# Patient Record
Sex: Female | Born: 1965 | Race: Black or African American | Hispanic: No | Marital: Single | Smoking: Never smoker
Health system: Southern US, Community
[De-identification: ages and names within clinical notes are randomized; demographics above are authoritative.]

## PROBLEM LIST (undated history)

## (undated) DIAGNOSIS — K5792 Diverticulitis of intestine, part unspecified, without perforation or abscess without bleeding: Secondary | ICD-10-CM

---

## 2017-04-08 ENCOUNTER — Encounter (HOSPITAL_BASED_OUTPATIENT_CLINIC_OR_DEPARTMENT_OTHER): Payer: Self-pay | Admitting: Emergency Medicine

## 2017-04-08 ENCOUNTER — Other Ambulatory Visit: Payer: Self-pay

## 2017-04-08 ENCOUNTER — Emergency Department (HOSPITAL_BASED_OUTPATIENT_CLINIC_OR_DEPARTMENT_OTHER)
Admission: EM | Admit: 2017-04-08 | Discharge: 2017-04-08 | Disposition: A | Discharge status: Home / Self Care | Payer: Self-pay | Attending: Emergency Medicine | Admitting: Emergency Medicine

## 2017-04-08 ENCOUNTER — Emergency Department (HOSPITAL_BASED_OUTPATIENT_CLINIC_OR_DEPARTMENT_OTHER): Payer: Self-pay

## 2017-04-08 DIAGNOSIS — R000 Tachycardia, unspecified: Secondary | ICD-10-CM | POA: Insufficient documentation

## 2017-04-08 DIAGNOSIS — Z20828 Contact with and (suspected) exposure to other viral communicable diseases: Secondary | ICD-10-CM | POA: Insufficient documentation

## 2017-04-08 DIAGNOSIS — J112 Influenza due to unidentified influenza virus with gastrointestinal manifestations: Secondary | ICD-10-CM | POA: Insufficient documentation

## 2017-04-08 DIAGNOSIS — J111 Influenza due to unidentified influenza virus with other respiratory manifestations: Secondary | ICD-10-CM

## 2017-04-08 DIAGNOSIS — R509 Fever, unspecified: Secondary | ICD-10-CM | POA: Insufficient documentation

## 2017-04-08 DIAGNOSIS — R69 Illness, unspecified: Secondary | ICD-10-CM

## 2017-04-08 HISTORY — DX: Diverticulitis of intestine, part unspecified, without perforation or abscess without bleeding: K57.92

## 2017-04-08 LAB — CBC WITH DIFFERENTIAL/PLATELET
BASOS PCT: 0 %
Basophils Absolute: 0 10*3/uL (ref 0.0–0.1)
Eosinophils Absolute: 0 10*3/uL (ref 0.0–0.7)
Eosinophils Relative: 0 %
HEMATOCRIT: 36.2 % (ref 36.0–46.0)
HEMOGLOBIN: 11.9 g/dL — AB (ref 12.0–15.0)
LYMPHS PCT: 6 %
Lymphs Abs: 0.3 10*3/uL — ABNORMAL LOW (ref 0.7–4.0)
MCH: 27.5 pg (ref 26.0–34.0)
MCHC: 32.9 g/dL (ref 30.0–36.0)
MCV: 83.8 fL (ref 78.0–100.0)
Monocytes Absolute: 0.2 10*3/uL (ref 0.1–1.0)
Monocytes Relative: 5 %
NEUTROS ABS: 4.3 10*3/uL (ref 1.7–7.7)
NEUTROS PCT: 89 %
Platelets: 205 10*3/uL (ref 150–400)
RBC: 4.32 MIL/uL (ref 3.87–5.11)
RDW: 12.3 % (ref 11.5–15.5)
WBC: 4.8 10*3/uL (ref 4.0–10.5)

## 2017-04-08 LAB — URINALYSIS, ROUTINE W REFLEX MICROSCOPIC
Bilirubin Urine: NEGATIVE
GLUCOSE, UA: NEGATIVE mg/dL
HGB URINE DIPSTICK: NEGATIVE
Ketones, ur: 15 mg/dL — AB
LEUKOCYTES UA: NEGATIVE
Nitrite: NEGATIVE
PH: 6 (ref 5.0–8.0)
Protein, ur: NEGATIVE mg/dL
Specific Gravity, Urine: 1.025 (ref 1.005–1.030)

## 2017-04-08 LAB — COMPREHENSIVE METABOLIC PANEL
ALK PHOS: 57 U/L (ref 38–126)
ALT: 17 U/L (ref 14–54)
AST: 21 U/L (ref 15–41)
Albumin: 3.8 g/dL (ref 3.5–5.0)
Anion gap: 9 (ref 5–15)
BUN: 11 mg/dL (ref 6–20)
CO2: 26 mmol/L (ref 22–32)
CREATININE: 0.63 mg/dL (ref 0.44–1.00)
Calcium: 9 mg/dL (ref 8.9–10.3)
Chloride: 100 mmol/L — ABNORMAL LOW (ref 101–111)
GFR calc Af Amer: >60 mL/min (ref >60)
GLUCOSE: 102 mg/dL — AB (ref 65–99)
POTASSIUM: 3.8 mmol/L (ref 3.5–5.1)
Sodium: 135 mmol/L (ref 135–145)
TOTAL PROTEIN: 7.2 g/dL (ref 6.5–8.1)
Total Bilirubin: 0.6 mg/dL (ref 0.3–1.2)

## 2017-04-08 LAB — LIPASE, BLOOD: Lipase: 23 U/L (ref 11–51)

## 2017-04-08 LAB — PREGNANCY, URINE: Preg Test, Ur: NEGATIVE

## 2017-04-08 MED ORDER — OSELTAMIVIR PHOSPHATE 75 MG PO CAPS: 75.0000 mg | ORAL_CAPSULE | Freq: Two times a day (BID) | ORAL | 0 refills | Status: DC

## 2017-04-08 MED ORDER — ONDANSETRON 4 MG PO TBDP
4.0000 mg | ORAL_TABLET | Freq: Once | ORAL | Status: AC | PRN
  Administered 2017-04-08: 4 mg via ORAL
  Filled 2017-04-08: qty 1

## 2017-04-08 MED ORDER — SODIUM CHLORIDE 0.9 % IV BOLUS (SEPSIS)
1000.0000 mL | Freq: Once | INTRAVENOUS | Status: AC
Start: 2017-04-08 — End: 2017-04-08
  Administered 2017-04-08: 1000 mL via INTRAVENOUS

## 2017-04-08 MED ORDER — ACETAMINOPHEN 325 MG PO TABS
650.0000 mg | ORAL_TABLET | Freq: Once | ORAL | Status: AC
  Administered 2017-04-08: 650 mg via ORAL
  Filled 2017-04-08: qty 2

## 2017-04-08 MED ORDER — ONDANSETRON HCL 4 MG PO TABS: 4.0000 mg | ORAL_TABLET | Freq: Three times a day (TID) | ORAL | 0 refills | Status: DC | PRN

## 2017-04-08 MED ORDER — OSELTAMIVIR PHOSPHATE 75 MG PO CAPS
75.0000 mg | ORAL_CAPSULE | Freq: Once | ORAL | Status: AC
  Administered 2017-04-08: 75 mg via ORAL
  Filled 2017-04-08: qty 1

## 2017-04-08 NOTE — ED Notes (Signed)
ED Provider at bedside. 

## 2017-04-08 NOTE — ED Triage Notes (Signed)
Vomiting and headache since yesterday.  

## 2017-04-08 NOTE — ED Notes (Signed)
Patient transported to X-ray 

## 2017-04-08 NOTE — ED Notes (Signed)
Pt given ginger ale and crackers per MD approval.

## 2017-04-08 NOTE — ED Notes (Signed)
Pt given d/c instructions as per chart. Rx x 2. Verbalizes understanding. No questions. 

## 2017-04-08 NOTE — Discharge Instructions (Signed)
Based on your recent exposure to influenza, we are concern your constellation of symptoms are due to influenza.  Please take the Tamiflu to help shorten the duration of your symptoms.  Please use the nausea medicine to help with nausea and vomiting so that you can stay hydrated.  We did not find evidence of bacterial infection on your imaging or labs.  Please follow-up with a primary care physician in several days for reassessment.  If any symptoms change or worsen, please return to the nearest emergency department.

## 2017-04-08 NOTE — ED Provider Notes (Signed)
[facility] EMERGENCY DEPARTMENT Provider Note   CSN: [id number] Arrival date & time: 04/08/17  1329     History   Chief Complaint Chief Complaint  Patient presents with  . Emesis  . Headache    HPI Richardean ChimeraMary Ann Loja is a 52 y.o. female.  The history is provided by the patient and medical records. No language interpreter was used.  URI   This is a new problem. The current episode started yesterday. The problem has not changed since onset.The maximum temperature recorded prior to her arrival was 100 to 100.9 F. Associated symptoms include diarrhea, nausea, vomiting, congestion, rhinorrhea and cough. Pertinent negatives include no chest pain, no abdominal pain, no dysuria, no headaches, no sore throat, no neck pain, no rash and no wheezing. She has tried nothing for the symptoms. The treatment provided no relief.    Past Medical History:  Diagnosis Date  . Diverticulitis     There are no active problems to display for this patient.   Past Surgical History:  Procedure Laterality Date  . CESAREAN SECTION      OB History    No data available       Home Medications    Prior to Admission medications   Not on File    Family History No family history on file.  Social History Social History   Tobacco Use  . Smoking status: Never Smoker  . Smokeless tobacco: Never Used  Substance Use Topics  . Alcohol use: No    Frequency: Never  . Drug use: No     Allergies   Patient has no known allergies.   Review of Systems Review of Systems  Constitutional: Positive for chills, fatigue and fever. Negative for diaphoresis.  HENT: Positive for congestion and rhinorrhea. Negative for sore throat.   Respiratory: Positive for cough. Negative for chest tightness, shortness of breath and wheezing.   Cardiovascular: Negative for chest pain and palpitations.  Gastrointestinal: Positive for diarrhea, nausea and vomiting. Negative for abdominal pain.  Genitourinary:  Negative for dysuria, flank pain and hematuria.  Musculoskeletal: Negative for back pain and neck pain.  Skin: Negative for rash.  Neurological: Negative for light-headedness, numbness and headaches.  Psychiatric/Behavioral: Negative for agitation.  All other systems reviewed and are negative.    Physical Exam Updated Vital Signs BP (!) 155/99 (BP Location: Left Arm)   Pulse (!) 110   Temp (!) 100.8 F (38.2 C) (Oral)   Resp 20   Ht 5\' 2"  (1.575 m)   Wt 93.4 kg (206 lb)   SpO2 94%   BMI 37.68 kg/m   Physical Exam  Constitutional: She is oriented to person, place, and time. She appears well-developed and well-nourished.  Non-toxic appearance. She does not appear ill. No distress.  HENT:  Head: Normocephalic and atraumatic.  Mouth/Throat: Oropharynx is clear and moist.  Eyes: Pupils are equal, round, and reactive to light.  Neck: Normal range of motion. Neck supple.  No nuchal rigidity or neck stiffness.  Full range of motion of neck without pain or tenderness.  Cardiovascular: Intact distal pulses. Tachycardia present.  No murmur heard. Pulmonary/Chest: Effort normal. No stridor. No respiratory distress. She has no wheezes. She exhibits no tenderness.  Abdominal: Soft. Bowel sounds are normal. There is no tenderness. There is no guarding.  Musculoskeletal: She exhibits no edema or tenderness.  Neurological: She is alert and oriented to person, place, and time. No cranial nerve deficit or sensory deficit. She exhibits normal muscle tone.  Coordination normal.  Skin: Skin is warm. Capillary refill takes less than 2 seconds. No rash noted. She is not diaphoretic. No erythema.  Psychiatric: She has a normal mood and affect.  Nursing note and vitals reviewed.    ED Treatments / Results  Labs (all labs ordered are listed, but only abnormal results are displayed) Labs Reviewed  CBC WITH DIFFERENTIAL/PLATELET - Abnormal; Notable for the following components:      Result Value    Hemoglobin 11.9 (*)    Lymphs Abs 0.3 (*)    All other components within normal limits  COMPREHENSIVE METABOLIC PANEL - Abnormal; Notable for the following components:   Chloride 100 (*)    Glucose, Bld 102 (*)    All other components within normal limits  URINALYSIS, ROUTINE W REFLEX MICROSCOPIC - Abnormal; Notable for the following components:   Ketones, ur 15 (*)    All other components within normal limits  URINE CULTURE  LIPASE, BLOOD  PREGNANCY, URINE    EKG  EKG Interpretation None       Radiology Dg Chest 2 View  Result Date: 04/08/2017 CLINICAL DATA:  Cough, fever, myalgias EXAM: CHEST  2 VIEW COMPARISON:  None. FINDINGS: Normal heart size. Normal mediastinal contour. No pneumothorax. No pleural effusion. Lungs appear clear, with no acute consolidative airspace disease and no pulmonary edema. IMPRESSION: No active cardiopulmonary disease. Electronically Signed   By: Delbert Phenix M.D.   On: 04/08/2017 16:18    Procedures Procedures (including critical care time)  Medications Ordered in ED Medications  ondansetron (ZOFRAN-ODT) disintegrating tablet 4 mg (4 mg Oral Given 04/08/17 1338)  acetaminophen (TYLENOL) tablet 650 mg (650 mg Oral Given 04/08/17 1339)  sodium chloride 0.9 % bolus 1,000 mL (0 mLs Intravenous Stopped 04/08/17 1734)  oseltamivir (TAMIFLU) capsule 75 mg (75 mg Oral Given 04/08/17 1548)     Initial Impression / Assessment and Plan / ED Course  I have reviewed the triage vital signs and the nursing notes.  Pertinent labs & imaging results that were available during my care of the patient were reviewed by me and considered in my medical decision making (see chart for details).     Vannia Pola is a 52 y.o. female with a past medical history significant for reflux and diverticulitis who presents with 2 days of fevers, chills, myalgias, malaise, oral intake, intermittent diarrhea, urinary frequency, rhinorrhea, congestion, cough, and headache.   Patient reports that her grandson has influenza and she has been feeling time with him.  Patient reports a mild headache with no neck pain or neck stiffness.  No rashes.  She says that she has not had much to eat or drink today due to the nausea vomiting and decreased appetite.  She reports some diarrhea but no constipation.  She denies dysuria but reports some urinary frequency.  She denies any abdominal pain, chest pain, or extremity pains.  She denies new edema in extremities.  She denies other complaints.  On exam, patient's lungs were clear.  Chest was nontender.  Abdomen nontender.  Legs were nonedematous and patient had pulses in all extremities.  No focal neurologic deficits seen.  Normal neck range of motion.  Patient had audible congestion and visible rhinorrhea.  Based on patient's known exposure to influenza I am concerned patient has the flu however due to her cough, urinary symptoms and fevers, patient will have workup to rule out concomitant bacterial infection with pneumonia, UTI.  Patient was given fluids due to her nausea  vomiting diarrhea to look for electrode abnormalities and dehydration.  She will be given fluids during initial workup.  Given her high concern for flu, patient will be treated empirically with Tamiflu.  Anticipate reassessment after fluids and workup.  Patient was feeling better after fluids.  No evidence of bacterial infection on chest x-ray or urinalysis.  No evidence of organ injury from her dehydration.  Suspect patient has influenza based on her known exposure.  Patient will be given prescription for Tamiflu and nausea medication.  Patient will be discharged with plans to follow-up with PCP as well as observe strict return precautions.    Patient has no other questions or concerns and understands plan of care.  Patient discharged in good condition.   Final Clinical Impressions(s) / ED Diagnoses   Final diagnoses:  Influenza-like illness    ED Discharge  Orders        Ordered    oseltamivir (TAMIFLU) 75 MG capsule  Every 12 hours     04/08/17 1907    ondansetron (ZOFRAN) 4 MG tablet  Every 8 hours PRN     04/08/17 1907      Clinical Impression: 1. Influenza-like illness     Disposition: Discharge  Condition: Good  I have discussed the results, Dx and Tx plan with the pt(& family if present). He/she/they expressed understanding and agree(s) with the plan. Discharge instructions discussed at great length. Strict return precautions discussed and pt &/or family have verbalized understanding of the instructions. No further questions at time of discharge.    New Prescriptions   ONDANSETRON (ZOFRAN) 4 MG TABLET    Take 1 tablet (4 mg total) by mouth every 8 (eight) hours as needed for nausea or vomiting.   OSELTAMIVIR (TAMIFLU) 75 MG CAPSULE    Take 1 capsule (75 mg total) by mouth every 12 (twelve) hours.    Follow Up: [facility] [address] [place] [address] [phone] Schedule an appointment as soon as possible for a visit    [facility] [place] EM[facility]Y [facility]PARTMENT [address] [id number] [address] [place] [address] [phone]       Normal Recinos, Canary Brim, MD 04/09/17 0030

## 2017-04-10 LAB — URINE CULTURE

## 2018-04-25 ENCOUNTER — Other Ambulatory Visit: Payer: Self-pay

## 2018-04-25 ENCOUNTER — Encounter (HOSPITAL_COMMUNITY): Payer: Self-pay | Admitting: Emergency Medicine

## 2018-04-25 ENCOUNTER — Emergency Department (HOSPITAL_COMMUNITY)
Admission: EM | Admit: 2018-04-25 | Discharge: 2018-04-25 | Disposition: A | Discharge status: Home / Self Care | Payer: Self-pay | Attending: Emergency Medicine | Admitting: Emergency Medicine

## 2018-04-25 DIAGNOSIS — R1032 Left lower quadrant pain: Secondary | ICD-10-CM | POA: Insufficient documentation

## 2018-04-25 DIAGNOSIS — K5900 Constipation, unspecified: Secondary | ICD-10-CM | POA: Insufficient documentation

## 2018-04-25 LAB — CBC
HCT: 38.4 % (ref 36.0–46.0)
Hemoglobin: 12.1 g/dL (ref 12.0–15.0)
MCH: 27.8 pg (ref 26.0–34.0)
MCHC: 31.5 g/dL (ref 30.0–36.0)
MCV: 88.1 fL (ref 80.0–100.0)
NRBC: 0 % (ref 0.0–0.2)
PLATELETS: 282 10*3/uL (ref 150–400)
RBC: 4.36 MIL/uL (ref 3.87–5.11)
RDW: 12 % (ref 11.5–15.5)
WBC: 7.3 10*3/uL (ref 4.0–10.5)

## 2018-04-25 LAB — COMPREHENSIVE METABOLIC PANEL
ALK PHOS: 67 U/L (ref 38–126)
ALT: 15 U/L (ref 0–44)
ANION GAP: 9 (ref 5–15)
AST: 19 U/L (ref 15–41)
Albumin: 4 g/dL (ref 3.5–5.0)
BILIRUBIN TOTAL: 0.6 mg/dL (ref 0.3–1.2)
BUN: 9 mg/dL (ref 6–20)
CALCIUM: 9.3 mg/dL (ref 8.9–10.3)
CO2: 28 mmol/L (ref 22–32)
CREATININE: 0.82 mg/dL (ref 0.44–1.00)
Chloride: 99 mmol/L (ref 98–111)
Glucose, Bld: 98 mg/dL (ref 70–99)
Potassium: 4 mmol/L (ref 3.5–5.1)
SODIUM: 136 mmol/L (ref 135–145)
TOTAL PROTEIN: 7.7 g/dL (ref 6.5–8.1)

## 2018-04-25 LAB — I-STAT BETA HCG BLOOD, ED (MC, WL, AP ONLY)

## 2018-04-25 LAB — URINALYSIS, ROUTINE W REFLEX MICROSCOPIC
Bilirubin Urine: NEGATIVE
GLUCOSE, UA: NEGATIVE mg/dL
Hgb urine dipstick: NEGATIVE
Ketones, ur: 20 mg/dL — AB
LEUKOCYTE UA: NEGATIVE
NITRITE: NEGATIVE
PROTEIN: NEGATIVE mg/dL
Specific Gravity, Urine: 1.014 (ref 1.005–1.030)
pH: 7 (ref 5.0–8.0)

## 2018-04-25 LAB — LIPASE, BLOOD: Lipase: 27 U/L (ref 11–51)

## 2018-04-25 MED ORDER — METRONIDAZOLE 500 MG PO TABS: 500.0000 mg | ORAL_TABLET | Freq: Two times a day (BID) | ORAL | 0 refills | Status: DC

## 2018-04-25 MED ORDER — CIPROFLOXACIN HCL 500 MG PO TABS
500.0000 mg | ORAL_TABLET | Freq: Once | ORAL | Status: AC
  Administered 2018-04-25: 500 mg via ORAL
  Filled 2018-04-25: qty 1

## 2018-04-25 MED ORDER — ONDANSETRON HCL 4 MG PO TABS: 4.0000 mg | ORAL_TABLET | Freq: Three times a day (TID) | ORAL | 0 refills | Status: DC | PRN

## 2018-04-25 MED ORDER — METRONIDAZOLE 500 MG PO TABS
500.0000 mg | ORAL_TABLET | Freq: Once | ORAL | Status: AC
  Administered 2018-04-25: 500 mg via ORAL
  Filled 2018-04-25: qty 1

## 2018-04-25 MED ORDER — KETOROLAC TROMETHAMINE 30 MG/ML IJ SOLN
30.0000 mg | Freq: Once | INTRAMUSCULAR | Status: AC
Start: 2018-04-25 — End: 2018-04-25
  Administered 2018-04-25: 30 mg via INTRAMUSCULAR
  Filled 2018-04-25: qty 1

## 2018-04-25 MED ORDER — CIPROFLOXACIN HCL 500 MG PO TABS: 500.0000 mg | ORAL_TABLET | Freq: Two times a day (BID) | ORAL | 0 refills | Status: DC

## 2018-04-25 MED ORDER — TRAMADOL HCL 50 MG PO TABS: 50.0000 mg | ORAL_TABLET | Freq: Four times a day (QID) | ORAL | 0 refills | Status: DC | PRN

## 2018-04-25 NOTE — ED Triage Notes (Signed)
Pt c/o lower abd pains for 2 days. Reports started taking probiotics once a day 2 weeks. Ago. Hx diverticulitis.

## 2018-04-25 NOTE — ED Provider Notes (Signed)
[facility]-EMERGENCY DEPT Provider Note   CSN: [id number] Arrival date & time: 04/25/18  1733    History   Chief Complaint Chief Complaint  Patient presents with  . Abdominal Pain    HPI Jennifer Stafford is a 53 y.o. female.     53 year old female with a history of diverticulitis presents to the emergency department for 2 days of lower abdominal pain.  She states that the pain has been constant and gradually worsening.  It waxes and wanes in severity.  She has tried over-the-counter probiotics as well as the use of clear liquids without improvement to her discomfort.  She has noted a small amount of bright red blood streaked stool with her bowel movements.  Endorses increased constipation, though she has continued to pass flatus.  No fevers, nausea, vomiting, urinary symptoms, melena.  Has a history of diverticulitis and states this feels similar.  Abdominal surgical history significant for cesarean section.  The history is provided by the patient. No language interpreter was used.  Abdominal Pain    Past Medical History:  Diagnosis Date  . Diverticulitis     There are no active problems to display for this patient.   Past Surgical History:  Procedure Laterality Date  . CESAREAN SECTION       OB History   No obstetric history on file.      Home Medications    Prior to Admission medications   Medication Sig Start Date End Date Taking? Authorizing Provider  ciprofloxacin (CIPRO) 500 MG tablet Take 1 tablet (500 mg total) by mouth 2 (two) times daily. 04/25/18   Antony Madura, PA-C  metroNIDAZOLE (FLAGYL) 500 MG tablet Take 1 tablet (500 mg total) by mouth 2 (two) times daily. 04/25/18   Antony Madura, PA-C  ondansetron (ZOFRAN) 4 MG tablet Take 1 tablet (4 mg total) by mouth every 8 (eight) hours as needed for nausea or vomiting. 04/25/18   Antony Madura, PA-C  oseltamivir (TAMIFLU) 75 MG capsule Take 1 capsule (75 mg total) by mouth every 12 (twelve)  hours. 04/08/17   Tegeler, Canary Brim, MD  traMADol (ULTRAM) 50 MG tablet Take 1 tablet (50 mg total) by mouth every 6 (six) hours as needed for severe pain. 04/25/18   Antony Madura, PA-C    Family History No family history on file.  Social History Social History   Tobacco Use  . Smoking status: Never Smoker  . Smokeless tobacco: Never Used  Substance Use Topics  . Alcohol use: Yes    Frequency: Never  . Drug use: No     Allergies   Patient has no known allergies.   Review of Systems Review of Systems  Gastrointestinal: Positive for abdominal pain.  Ten systems reviewed and are negative for acute change, except as noted in the HPI.    Physical Exam Updated Vital Signs BP (!) 156/92 (BP Location: Left Arm)   Pulse 96   Temp 99.1 F (37.3 C) (Oral)   Ht 5\' 2"  (1.575 m)   Wt 94 kg   SpO2 100%   BMI 37.90 kg/m   Physical Exam Vitals signs and nursing note reviewed.  Constitutional:      General: She is not in acute distress.    Appearance: She is well-developed. She is not diaphoretic.     Comments: Nontoxic appearing and in NAD  HENT:     Head: Normocephalic and atraumatic.  Eyes:     General: No scleral icterus.  Conjunctiva/sclera: Conjunctivae normal.  Neck:     Musculoskeletal: Normal range of motion.  Cardiovascular:     Rate and Rhythm: Normal rate and regular rhythm.     Pulses: Normal pulses.  Pulmonary:     Effort: Pulmonary effort is normal. No respiratory distress.     Breath sounds: No stridor. No wheezing.     Comments: Respirations even and unlabored Abdominal:     Comments: Abdomen soft, obese.  There is tenderness to palpation in the left lower quadrant with mild guarding.  No peritoneal signs or palpable masses.  Musculoskeletal: Normal range of motion.  Skin:    General: Skin is warm and dry.     Coloration: Skin is not pale.     Findings: No erythema or rash.  Neurological:     Mental Status: She is alert and oriented to person,  place, and time.  Psychiatric:        Behavior: Behavior normal.      ED Treatments / Results  Labs (all labs ordered are listed, but only abnormal results are displayed) Labs Reviewed  URINALYSIS, ROUTINE W REFLEX MICROSCOPIC - Abnormal; Notable for the following components:      Result Value   Ketones, ur 20 (*)    All other components within normal limits  LIPASE, BLOOD  COMPREHENSIVE METABOLIC PANEL  CBC  I-STAT BETA HCG BLOOD, ED ([facility], [facility], [facility] ONLY)    EKG None  Radiology No results found.  Procedures Procedures (including critical care time)  Medications Ordered in ED Medications  ciprofloxacin (CIPRO) tablet 500 mg (has no administration in time range)  metroNIDAZOLE (FLAGYL) tablet 500 mg (has no administration in time range)  ketorolac (TORADOL) 30 MG/ML injection 30 mg (has no administration in time range)     Initial Impression / Assessment and Plan / ED Course  I have reviewed the triage vital signs and the nursing notes.  Pertinent labs & imaging results that were available during my care of the patient were reviewed by me and considered in my medical decision making (see chart for details).        53 year old female presents for 2 days of worsening lower abdominal pain.  She has focal tenderness in the left lower quadrant without peritoneal signs on exam.  No palpable masses.  Clinically, she is well-appearing.  Noted to be afebrile.  She does not meet criteria for SIRS or sepsis.  Labs reassuring without leukocytosis or electrolyte derangements.  Liver and kidney function preserved.  Urinalysis negative for UTI.  Given history of diverticulitis with focal left lower quadrant tenderness, it is likely that she is experiencing a recurrence of symptoms today.  Plan to start on ciprofloxacin and Flagyl for management.  Will forego imaging at this time given low risk for abscess or perforation.  She has been instructed to return to the emergency department for  new or concerning symptoms including worsening abdominal pain, fever, intractable vomiting.  Patient discharged in stable condition with no unaddressed concerns.   Final Clinical Impressions(s) / ED Diagnoses   Final diagnoses:  Left lower quadrant abdominal pain    ED Discharge Orders         Ordered    ciprofloxacin (CIPRO) 500 MG tablet  2 times daily     04/25/18 2309    metroNIDAZOLE (FLAGYL) 500 MG tablet  2 times daily     04/25/18 2309    traMADol (ULTRAM) 50 MG tablet  Every 6 hours PRN  04/25/18 2309    ondansetron (ZOFRAN) 4 MG tablet  Every 8 hours PRN     04/25/18 2309           Antony Madura, Cordelia Poche 04/25/18 2320    Jacalyn Lefevre, MD 04/25/18 [phone]

## 2018-04-25 NOTE — Discharge Instructions (Signed)
You are being treated for presumed diverticulitis.  Take ciprofloxacin and Flagyl as prescribed until finished.  We recommend a clear liquid diet until your symptoms begin to improve.  Continue Tylenol as needed for management of pain.  You may take Zofran as prescribed for nausea.  If your pain is severe, use tramadol as prescribed.  Do not drive or drink alcohol after taking this medication as it may make you drowsy and impair your judgment.  Return to the emergency department for repeat evaluation if you notice worsening abdominal pain, increased vomiting with inability to tolerate fluids by mouth, fever over 101F, or other new or concerning symptoms.  Also return if your symptoms do not improve after 3 days of antibiotic use.

## 2018-09-29 ENCOUNTER — Emergency Department (HOSPITAL_COMMUNITY): Payer: Self-pay

## 2018-09-29 ENCOUNTER — Other Ambulatory Visit: Payer: Self-pay

## 2018-09-29 ENCOUNTER — Emergency Department (HOSPITAL_COMMUNITY)
Admission: EM | Admit: 2018-09-29 | Discharge: 2018-09-29 | Disposition: A | Discharge status: Home / Self Care | Payer: Self-pay | Attending: Emergency Medicine | Admitting: Emergency Medicine

## 2018-09-29 ENCOUNTER — Encounter (HOSPITAL_COMMUNITY): Payer: Self-pay | Admitting: Emergency Medicine

## 2018-09-29 DIAGNOSIS — K5792 Diverticulitis of intestine, part unspecified, without perforation or abscess without bleeding: Secondary | ICD-10-CM | POA: Insufficient documentation

## 2018-09-29 LAB — CBC
HCT: 39 % (ref 36.0–46.0)
Hemoglobin: 12.7 g/dL (ref 12.0–15.0)
MCH: 28.5 pg (ref 26.0–34.0)
MCHC: 32.6 g/dL (ref 30.0–36.0)
MCV: 87.4 fL (ref 80.0–100.0)
Platelets: 248 10*3/uL (ref 150–400)
RBC: 4.46 MIL/uL (ref 3.87–5.11)
RDW: 12.8 % (ref 11.5–15.5)
WBC: 7.6 10*3/uL (ref 4.0–10.5)
nRBC: 0 % (ref 0.0–0.2)

## 2018-09-29 LAB — I-STAT BETA HCG BLOOD, ED (MC, WL, AP ONLY): I-stat hCG, quantitative: <5 m[IU]/mL (ref <5)

## 2018-09-29 LAB — COMPREHENSIVE METABOLIC PANEL
ALT: 17 U/L (ref 0–44)
AST: 19 U/L (ref 15–41)
Albumin: 4.1 g/dL (ref 3.5–5.0)
Alkaline Phosphatase: 66 U/L (ref 38–126)
Anion gap: 11 (ref 5–15)
BUN: 14 mg/dL (ref 6–20)
CO2: 27 mmol/L (ref 22–32)
Calcium: 9.3 mg/dL (ref 8.9–10.3)
Chloride: 99 mmol/L (ref 98–111)
Creatinine, Ser: 0.85 mg/dL (ref 0.44–1.00)
GFR calc Af Amer: >60 mL/min (ref >60)
GFR calc non Af Amer: >60 mL/min (ref >60)
Glucose, Bld: 99 mg/dL (ref 70–99)
Potassium: 3.7 mmol/L (ref 3.5–5.1)
Sodium: 137 mmol/L (ref 135–145)
Total Bilirubin: 0.9 mg/dL (ref 0.3–1.2)
Total Protein: 7.6 g/dL (ref 6.5–8.1)

## 2018-09-29 LAB — URINALYSIS, ROUTINE W REFLEX MICROSCOPIC
Glucose, UA: NEGATIVE mg/dL
Hgb urine dipstick: NEGATIVE
Ketones, ur: 15 mg/dL — AB
Leukocytes,Ua: NEGATIVE
Nitrite: NEGATIVE
Protein, ur: 100 mg/dL — AB
Specific Gravity, Urine: 1.015 (ref 1.005–1.030)
pH: 8.5 — ABNORMAL HIGH (ref 5.0–8.0)

## 2018-09-29 LAB — LIPASE, BLOOD: Lipase: 26 U/L (ref 11–51)

## 2018-09-29 LAB — URINALYSIS, MICROSCOPIC (REFLEX)
Bacteria, UA: NONE SEEN
RBC / HPF: NONE SEEN RBC/hpf (ref 0–5)

## 2018-09-29 MED ORDER — METRONIDAZOLE 500 MG PO TABS
500.0000 mg | ORAL_TABLET | Freq: Once | ORAL | Status: AC
  Administered 2018-09-29: 500 mg via ORAL
  Filled 2018-09-29: qty 1

## 2018-09-29 MED ORDER — HYDROMORPHONE HCL 1 MG/ML IJ SOLN
0.5000 mg | Freq: Once | INTRAMUSCULAR | Status: AC
  Administered 2018-09-29: 22:00:00 0.5 mg via INTRAVENOUS
  Filled 2018-09-29: qty 1

## 2018-09-29 MED ORDER — IOHEXOL 300 MG/ML  SOLN
100.0000 mL | Freq: Once | INTRAMUSCULAR | Status: AC | PRN
  Administered 2018-09-29: 22:00:00 100 mL via INTRAVENOUS

## 2018-09-29 MED ORDER — METRONIDAZOLE 500 MG PO TABS: 500.0000 mg | ORAL_TABLET | Freq: Two times a day (BID) | ORAL | 0 refills | Status: DC

## 2018-09-29 MED ORDER — CIPROFLOXACIN HCL 500 MG PO TABS
500.0000 mg | ORAL_TABLET | Freq: Once | ORAL | Status: AC
  Administered 2018-09-29: 500 mg via ORAL
  Filled 2018-09-29: qty 1

## 2018-09-29 MED ORDER — TRAMADOL HCL 50 MG PO TABS: 50.0000 mg | ORAL_TABLET | Freq: Four times a day (QID) | ORAL | 0 refills | Status: DC | PRN

## 2018-09-29 MED ORDER — ONDANSETRON HCL 4 MG/2ML IJ SOLN
4.0000 mg | Freq: Once | INTRAMUSCULAR | Status: AC
  Administered 2018-09-29: 4 mg via INTRAVENOUS
  Filled 2018-09-29: qty 2

## 2018-09-29 MED ORDER — SODIUM CHLORIDE 0.9% FLUSH: 3.0000 mL | Freq: Once | INTRAVENOUS | Status: DC

## 2018-09-29 MED ORDER — SODIUM CHLORIDE 0.9 % IV BOLUS
1000.0000 mL | Freq: Once | INTRAVENOUS | Status: AC
  Administered 2018-09-29: 21:00:00 1000 mL via INTRAVENOUS

## 2018-09-29 MED ORDER — SODIUM CHLORIDE (PF) 0.9 % IJ SOLN
INTRAMUSCULAR | Status: AC
  Filled 2018-09-29: qty 50

## 2018-09-29 MED ORDER — ONDANSETRON HCL 4 MG PO TABS: 4.0000 mg | ORAL_TABLET | Freq: Three times a day (TID) | ORAL | 0 refills | Status: DC | PRN

## 2018-09-29 MED ORDER — CIPROFLOXACIN HCL 500 MG PO TABS: 500.0000 mg | ORAL_TABLET | Freq: Two times a day (BID) | ORAL | 0 refills | Status: DC

## 2018-09-29 NOTE — Discharge Instructions (Addendum)
See your Physician for recheck.  Return if any problems.  °

## 2018-09-29 NOTE — ED Triage Notes (Signed)
Patient c/o generalized abdominal pain since yesterday. Reports one episode of vomiting and one stool with blood. Hx diverticulitis.

## 2018-09-29 NOTE — ED Notes (Signed)
Pt requesting drink, informed her she will have to wait to see the MD first

## 2018-09-30 NOTE — ED Provider Notes (Signed)
[facility]-EMERGENCY DEPT Provider Note   CSN: [id number] Arrival date & time: 09/29/18  1952     History   Chief Complaint Chief Complaint  Patient presents with   Abdominal Pain    HPI Jennifer Stafford is a 53 y.o. female.     The history is provided by the patient. No language interpreter was used.  Abdominal Pain Pain location:  LLQ Pain quality: aching   Pain radiates to:  Does not radiate Pain severity:  Moderate Onset quality:  Gradual Timing:  Constant Progression:  Worsening Chronicity:  New Relieved by:  Nothing Worsened by:  Nothing Ineffective treatments:  None tried Associated symptoms: nausea and vomiting   Risk factors: no alcohol abuse and has not had multiple surgeries   Pt reports she has had diverticulitis in the past.  Pt feels like she may have again.  Pt reports severe pain left lower abdomen,  Pt vomited once today.   Past Medical History:  Diagnosis Date   Diverticulitis     There are no active problems to display for this patient.   Past Surgical History:  Procedure Laterality Date   CESAREAN SECTION       OB History   No obstetric history on file.      Home Medications    Prior to Admission medications   Medication Sig Start Date End Date Taking? Authorizing Provider  acetaminophen (TYLENOL) 500 MG tablet Take 1,000 mg by mouth every 6 (six) hours as needed for moderate pain.   Yes [provider]  aspirin-sod bicarb-citric acid (ALKA-SELTZER ORIGINAL) 325 MG TBEF tablet Take 325 mg by mouth every 6 (six) hours as needed (acid reflux).   Yes [provider]  ciprofloxacin (CIPRO) 500 MG tablet Take 1 tablet (500 mg total) by mouth 2 (two) times daily. 09/29/18   Elson AreasSofia, Jaleeya Mcnelly K, PA-C  metroNIDAZOLE (FLAGYL) 500 MG tablet Take 1 tablet (500 mg total) by mouth 2 (two) times daily. 09/29/18   Elson AreasSofia, Ellawyn Wogan K, PA-C  ondansetron (ZOFRAN) 4 MG tablet Take 1 tablet (4 mg total) by mouth every 8  (eight) hours as needed for nausea or vomiting. 09/29/18   Elson AreasSofia, Jozee Hammer K, PA-C  traMADol (ULTRAM) 50 MG tablet Take 1 tablet (50 mg total) by mouth every 6 (six) hours as needed for severe pain. 09/29/18   Elson AreasSofia, Kahlea Cobert K, PA-C    Family History No family history on file.  Social History Social History   Tobacco Use   Smoking status: Never Smoker   Smokeless tobacco: Never Used  Substance Use Topics   Alcohol use: Yes    Frequency: Never   Drug use: No     Allergies   Patient has no known allergies.   Review of Systems Review of Systems  Gastrointestinal: Positive for abdominal pain, nausea and vomiting.  All other systems reviewed and are negative.    Physical Exam Updated Vital Signs BP (!) 149/83    Pulse 97    Temp 99.7 F (37.6 C) (Oral)    Resp 18    Ht 5\' 2"  (1.575 m)    Wt 99.8 kg    SpO2 98%    BMI 40.24 kg/m   Physical Exam Vitals signs and nursing note reviewed.  Constitutional:      Appearance: She is well-developed.  HENT:     Head: Normocephalic.  Neck:     Musculoskeletal: Normal range of motion.  Cardiovascular:     Rate and Rhythm:  Normal rate and regular rhythm.  Pulmonary:     Effort: Pulmonary effort is normal.  Abdominal:     General: Abdomen is flat. There is no distension.     Tenderness: There is abdominal tenderness in the left lower quadrant. There is no guarding.     Hernia: No hernia is present.  Musculoskeletal: Normal range of motion.  Skin:    General: Skin is warm.  Neurological:     Mental Status: She is alert and oriented to person, place, and time.      ED Treatments / Results  Labs (all labs ordered are listed, but only abnormal results are displayed) Labs Reviewed  URINALYSIS, ROUTINE W REFLEX MICROSCOPIC - Abnormal; Notable for the following components:      Result Value   pH 8.5 (*)    Bilirubin Urine SMALL (*)    Ketones, ur 15 (*)    Protein, ur 100 (*)    All other components within normal limits    LIPASE, BLOOD  COMPREHENSIVE METABOLIC PANEL  CBC  URINALYSIS, MICROSCOPIC (REFLEX)  I-STAT BETA HCG BLOOD, ED ([facility], [facility], [facility] ONLY)    EKG None  Radiology Ct Abdomen Pelvis W Contrast  Result Date: 09/29/2018 CLINICAL DATA:  Abdominal pain with diverticulitis suspected EXAM: CT ABDOMEN AND PELVIS WITH CONTRAST TECHNIQUE: Multidetector CT imaging of the abdomen and pelvis was performed using the standard protocol following bolus administration of intravenous contrast. CONTRAST:  100mL OMNIPAQUE IOHEXOL 300 MG/ML  SOLN COMPARISON:  CT from 09/28/2016 could not be retrieved for comparison. The report is available for review. FINDINGS: Lower chest: The lung bases are clear. The heart size is normal. Hepatobiliary: The liver is normal. Normal gallbladder.There is no biliary ductal dilation. Pancreas: Normal contours without ductal dilatation. No peripancreatic fluid collection. Spleen: No splenic laceration or hematoma. Adrenals/Urinary Tract: --Adrenal glands: No adrenal hemorrhage. --Right kidney/ureter: No hydronephrosis or perinephric hematoma. --Left kidney/ureter: No hydronephrosis or perinephric hematoma. --Urinary bladder: Unremarkable. Stomach/Bowel: --Stomach/Duodenum: There is a small hiatal hernia. --Small bowel: No dilatation or inflammation. --Colon: There is acute sigmoid diverticulitis. They are is no significant surrounding free air. There is no adjacent drainable fluid collection concerning for abscess. There is scattered colonic diverticula throughout the remaining portions of the colon. --Appendix: The appendix is moderately dilated measuring up to approximately 9 mm proximally. There are several appendicular lists towards the distal aspect of the appendix. The appendix remains air-filled without significant periappendiceal inflammatory changes. Vascular/Lymphatic: Normal course and caliber of the major abdominal vessels. --No retroperitoneal lymphadenopathy. --No mesenteric  lymphadenopathy. --No pelvic or inguinal lymphadenopathy. Reproductive: There is a fibroid uterus. There is a 5.7 by 5.2 cm by 6.6 cm left ovarian fat containing lesion. There is a small punctate calcification within this lesion. Other: No ascites or free air. The abdominal wall is normal. Musculoskeletal. No acute displaced fractures. IMPRESSION: 1. Acute uncomplicated sigmoid diverticulitis. 2. Dilated appendix with multiple appendicolths but no definite CT evidence for acute appendicitis. 3. Fibroid uterus. 4. 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 Electronically Signed   By: Katherine Mantlehristopher  Green M.D.   On: 09/29/2018 22:15    Procedures Procedures (including critical care time)  Medications Ordered in ED Medications  sodium chloride flush (NS) 0.9 % injection 3 mL (has no administration in time range)  sodium chloride (PF) 0.9 % injection (has no administration in time range)  sodium chloride 0.9 % bolus 1,000 mL (0 mLs Intravenous Stopped 09/29/18 2255)  iohexol (OMNIPAQUE) 300 MG/ML solution  100 mL (100 mLs Intravenous Contrast Given 09/29/18 2151)  HYDROmorphone (DILAUDID) injection 0.5 mg (0.5 mg Intravenous Given 09/29/18 2210)  ondansetron (ZOFRAN) injection 4 mg (4 mg Intravenous Given 09/29/18 2210)  ciprofloxacin (CIPRO) tablet 500 mg (500 mg Oral Given 09/29/18 2305)  metroNIDAZOLE (FLAGYL) tablet 500 mg (500 mg Oral Given 09/29/18 2305)     Initial Impression / Assessment and Plan / ED Course  I have reviewed the triage vital signs and the nursing notes.  Pertinent labs & imaging results that were available during my care of the patient were reviewed by me and considered in my medical decision making (see chart for details).        MDM  Dr. Sedonia Small in to see and examine  Pt counseled on results of ct including diverticulitis, fibroid and ovarian cyst   Pt advised to follow up with her primary MD.  Pt started on cipro and flagyl.    Final Clinical Impressions(s) / ED Diagnoses    Final diagnoses:  Diverticulitis    ED Discharge Orders         Ordered    ciprofloxacin (CIPRO) 500 MG tablet  2 times daily     09/29/18 2253    metroNIDAZOLE (FLAGYL) 500 MG tablet  2 times daily     09/29/18 2253    traMADol (ULTRAM) 50 MG tablet  Every 6 hours PRN     09/29/18 2253    ondansetron (ZOFRAN) 4 MG tablet  Every 8 hours PRN     09/29/18 2253        An After Visit Summary was printed and given to the patient.    Fransico Meadow, PA-C 09/30/18 0014    Rolland Porter, MD 09/30/18 [phone]

## 2021-03-05 IMAGING — CT CT ABDOMEN AND PELVIS WITH CONTRAST
1 of 3 series · 13 of 32 positions shown, 18 images · IV contrast (omnipaque)
Comparison: CT from 09/28/2016 could not be retrieved for
comparison. The report is available for review.

CLINICAL DATA: Abdominal pain with diverticulitis suspected

EXAM:
CT ABDOMEN AND PELVIS WITH CONTRAST
TECHNIQUE: Multidetector CT imaging of the abdomen and pelvis was performed
using the standard protocol following bolus administration of
intravenous contrast.
CONTRAST:  100mL OMNIPAQUE IOHEXOL 300 MG/ML  SOLN

[Series 2: axial st · axial · 0.75mm/px · z∈[+1007,+1387]mm · 13 of 88 slices shown, 18 images]
[im 6/88  soft-tissue]
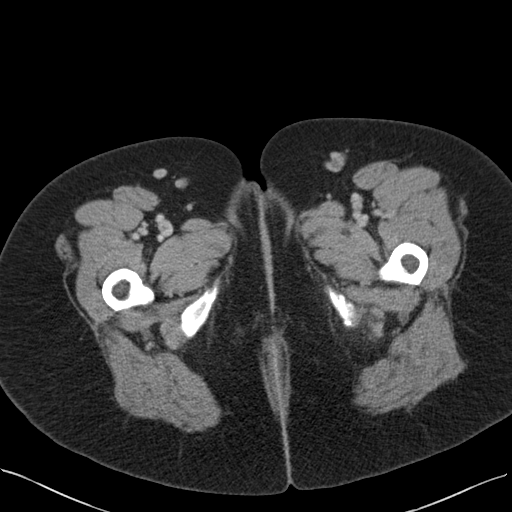
[im 6/88  bone]
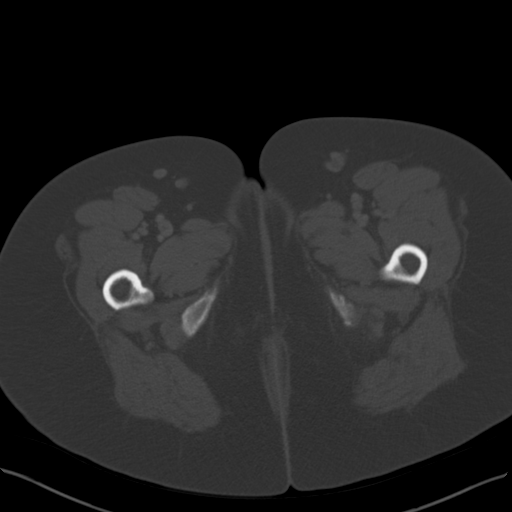
[im 16/88  soft-tissue]
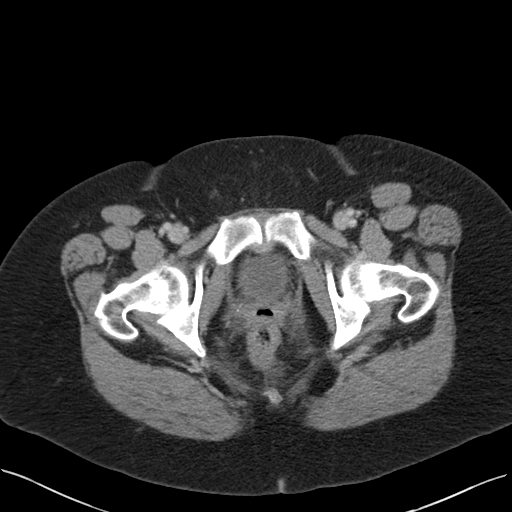
[im 21/88  soft-tissue]
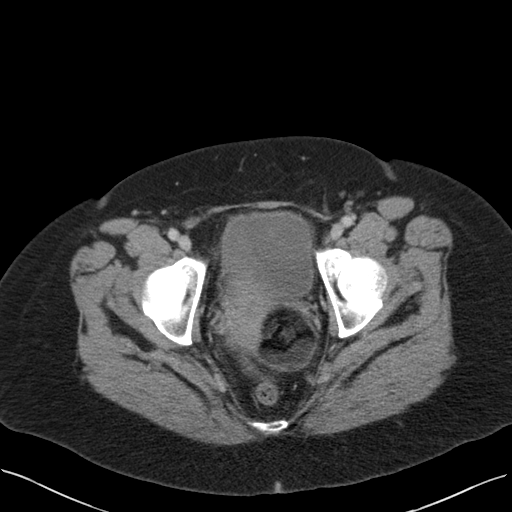
[im 26/88  soft-tissue]
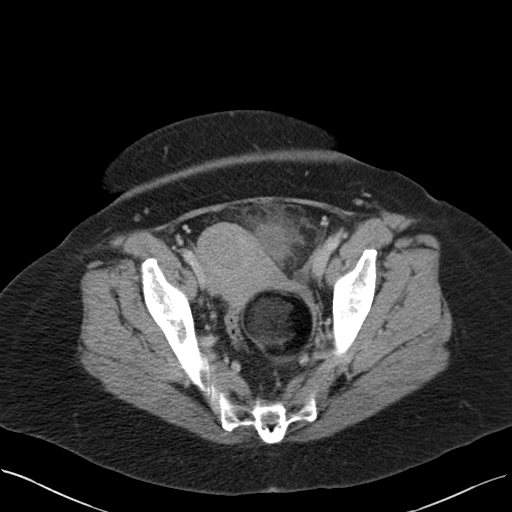
[im 36/88  soft-tissue]
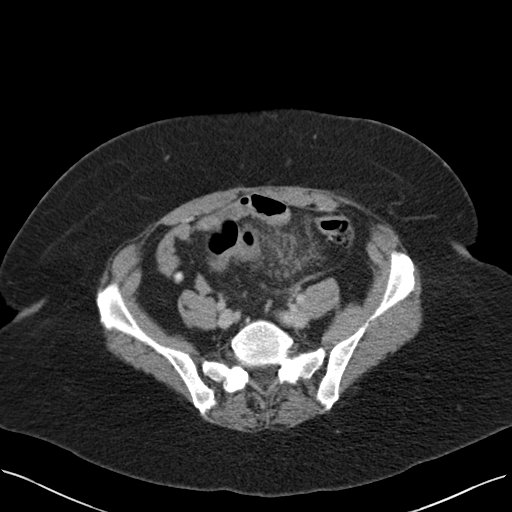
[im 41/88  soft-tissue]
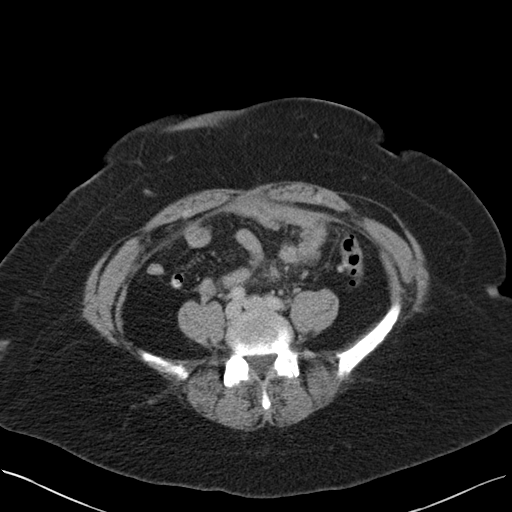
[im 47/88  soft-tissue]
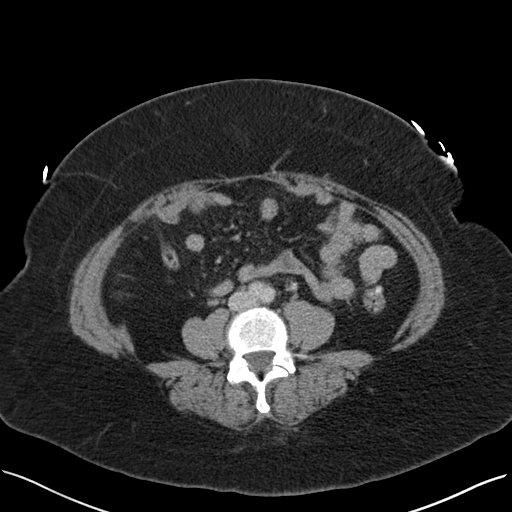
[im 57/88  soft-tissue]
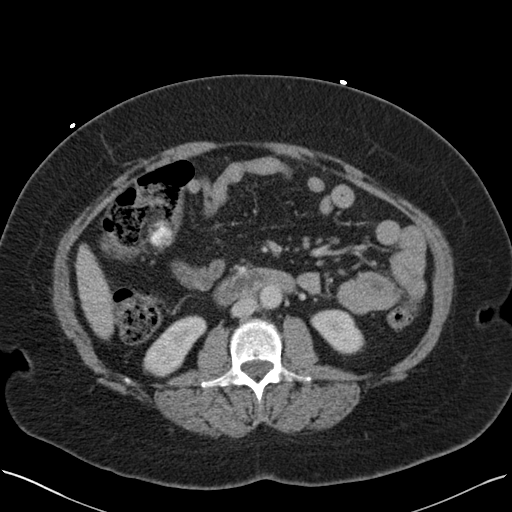
[im 62/88  soft-tissue]
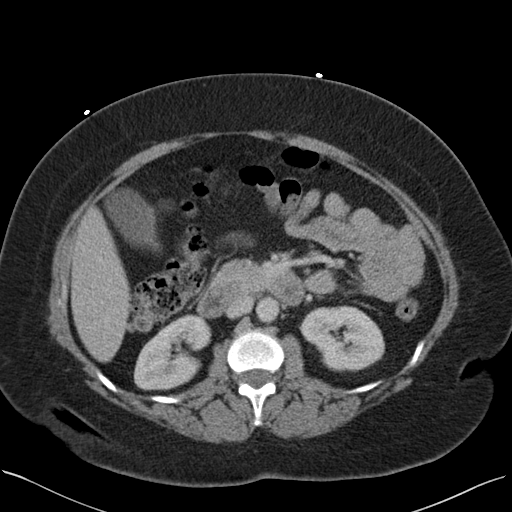
[im 62/88  bone]
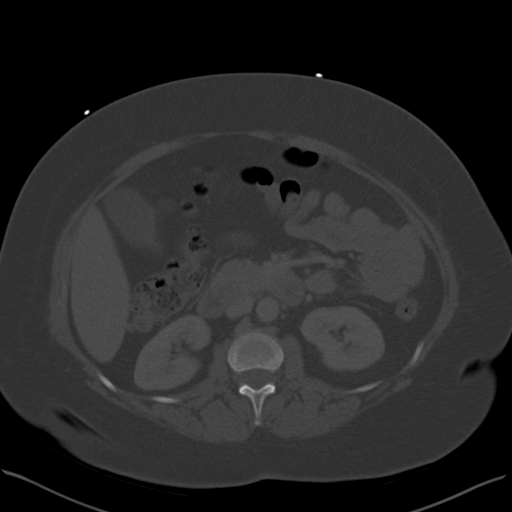
[im 67/88  soft-tissue]
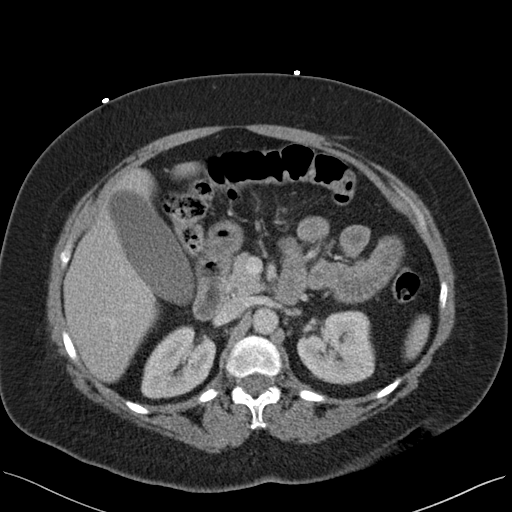
[im 67/88  lung]
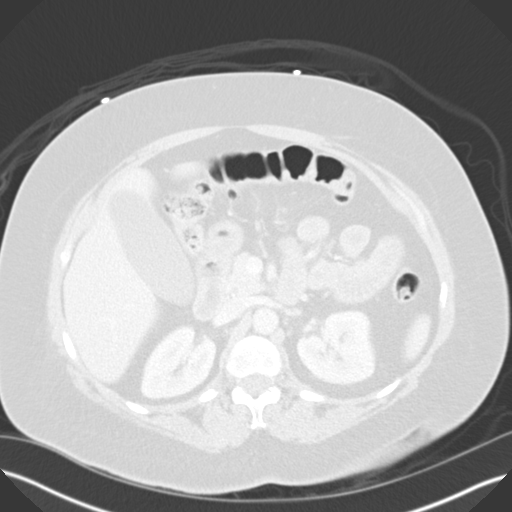
[im 72/88  lung]
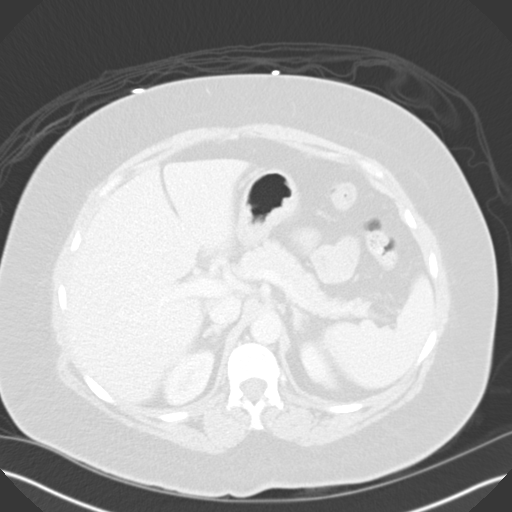
[im 77/88  soft-tissue]
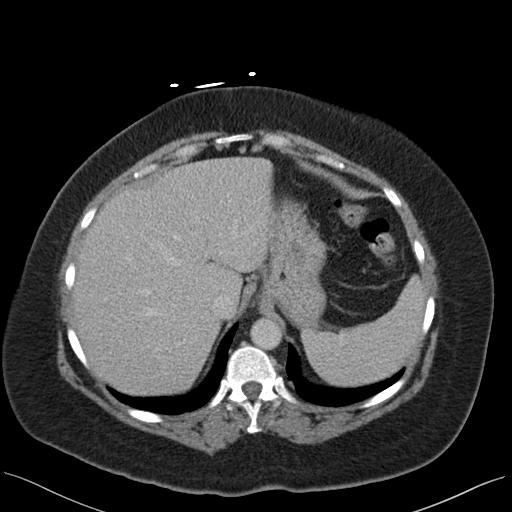
[im 77/88  lung]
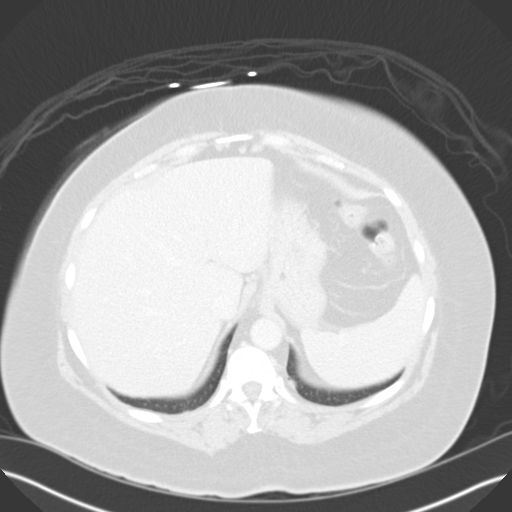
[im 82/88  soft-tissue]
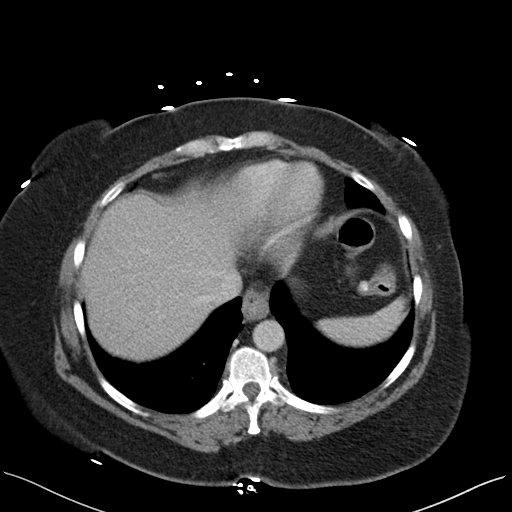
[im 82/88  lung]
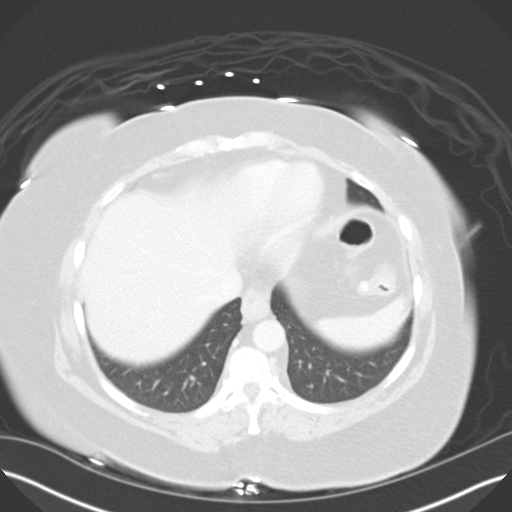

[13 of 32 positions shown; findings below may reference images not displayed]

FINDINGS: Lower chest: The lung bases are clear. The heart size is normal.

Hepatobiliary: The liver is normal. Normal gallbladder.There is no
biliary ductal dilation.

Pancreas: Normal contours without ductal dilatation. No
peripancreatic fluid collection.

Spleen: No splenic laceration or hematoma.

Adrenals/Urinary Tract:

--Adrenal glands: No adrenal hemorrhage.

--Right kidney/ureter: No hydronephrosis or perinephric hematoma.

--Left kidney/ureter: No hydronephrosis or perinephric hematoma.

--Urinary bladder: Unremarkable.

Stomach/Bowel:

--Stomach/Duodenum: There is a small hiatal hernia.

--Small bowel: No dilatation or inflammation.

--Colon: There is acute sigmoid diverticulitis. They are is no
significant surrounding free air. There is no adjacent drainable
fluid collection concerning for abscess. There is scattered colonic
diverticula throughout the remaining portions of the colon.

--Appendix: The appendix is moderately dilated measuring up to
approximately 9 mm proximally. There are several appendicular lists
towards the distal aspect of the appendix. The appendix remains
air-filled without significant periappendiceal inflammatory changes.

Vascular/Lymphatic: Normal course and caliber of the major abdominal
vessels.

--No retroperitoneal lymphadenopathy.

--No mesenteric lymphadenopathy.

--No pelvic or inguinal lymphadenopathy.

Reproductive: There is a fibroid uterus. There is a 5.7 by 5.2 cm by
6.6 cm left ovarian fat containing lesion. There is a small punctate
calcification within this lesion.

Other: No ascites or free air. The abdominal wall is normal.

Musculoskeletal. No acute displaced fractures.
IMPRESSION: 1. Acute uncomplicated sigmoid diverticulitis.
2. Dilated appendix with multiple appendicolths but no definite CT
evidence for acute appendicitis.
3. Fibroid uterus.
4. Again noted is a 6.6 cm left ovarian dermoid.

## 2022-01-20 ENCOUNTER — Emergency Department (HOSPITAL_BASED_OUTPATIENT_CLINIC_OR_DEPARTMENT_OTHER): Payer: BLUE CROSS/BLUE SHIELD

## 2022-01-20 ENCOUNTER — Emergency Department (HOSPITAL_BASED_OUTPATIENT_CLINIC_OR_DEPARTMENT_OTHER)
Admission: EM | Admit: 2022-01-20 | Discharge: 2022-01-20 | Disposition: A | Discharge status: Home / Self Care | Payer: BLUE CROSS/BLUE SHIELD | Source: Home / Self Care | Attending: Emergency Medicine | Admitting: Emergency Medicine

## 2022-01-20 ENCOUNTER — Encounter (HOSPITAL_BASED_OUTPATIENT_CLINIC_OR_DEPARTMENT_OTHER): Payer: Self-pay

## 2022-01-20 DIAGNOSIS — K5732 Diverticulitis of large intestine without perforation or abscess without bleeding: Secondary | ICD-10-CM | POA: Insufficient documentation

## 2022-01-20 DIAGNOSIS — K5792 Diverticulitis of intestine, part unspecified, without perforation or abscess without bleeding: Secondary | ICD-10-CM

## 2022-01-20 DIAGNOSIS — Z6836 Body mass index (BMI) 36.0-36.9, adult: Secondary | ICD-10-CM | POA: Diagnosis not present

## 2022-01-20 DIAGNOSIS — E6609 Other obesity due to excess calories: Secondary | ICD-10-CM | POA: Diagnosis not present

## 2022-01-20 DIAGNOSIS — R109 Unspecified abdominal pain: Secondary | ICD-10-CM | POA: Diagnosis present

## 2022-01-20 DIAGNOSIS — Z7982 Long term (current) use of aspirin: Secondary | ICD-10-CM | POA: Diagnosis not present

## 2022-01-20 DIAGNOSIS — Z79899 Other long term (current) drug therapy: Secondary | ICD-10-CM | POA: Diagnosis not present

## 2022-01-20 LAB — COMPREHENSIVE METABOLIC PANEL
ALT: 15 U/L (ref 0–44)
AST: 16 U/L (ref 15–41)
Albumin: 3.7 g/dL (ref 3.5–5.0)
Alkaline Phosphatase: 61 U/L (ref 38–126)
Anion gap: 6 (ref 5–15)
BUN: 12 mg/dL (ref 6–20)
CO2: 26 mmol/L (ref 22–32)
Calcium: 9.3 mg/dL (ref 8.9–10.3)
Chloride: 105 mmol/L (ref 98–111)
Creatinine, Ser: 0.67 mg/dL (ref 0.44–1.00)
GFR, Estimated: >60 mL/min (ref >60)
Glucose, Bld: 91 mg/dL (ref 70–99)
Potassium: 4 mmol/L (ref 3.5–5.1)
Sodium: 137 mmol/L (ref 135–145)
Total Bilirubin: 0.7 mg/dL (ref 0.3–1.2)
Total Protein: 7.4 g/dL (ref 6.5–8.1)

## 2022-01-20 LAB — URINALYSIS, ROUTINE W REFLEX MICROSCOPIC
Bilirubin Urine: NEGATIVE
Glucose, UA: NEGATIVE mg/dL
Hgb urine dipstick: NEGATIVE
Ketones, ur: NEGATIVE mg/dL
Leukocytes,Ua: NEGATIVE
Nitrite: NEGATIVE
Protein, ur: NEGATIVE mg/dL
Specific Gravity, Urine: 1.02 (ref 1.005–1.030)
pH: 5.5 (ref 5.0–8.0)

## 2022-01-20 LAB — CBC WITH DIFFERENTIAL/PLATELET
Abs Immature Granulocytes: 0.01 10*3/uL (ref 0.00–0.07)
Basophils Absolute: 0 10*3/uL (ref 0.0–0.1)
Basophils Relative: 1 %
Eosinophils Absolute: 0.1 10*3/uL (ref 0.0–0.5)
Eosinophils Relative: 2 %
HCT: 34.7 % — ABNORMAL LOW (ref 36.0–46.0)
Hemoglobin: 11.3 g/dL — ABNORMAL LOW (ref 12.0–15.0)
Immature Granulocytes: 0 %
Lymphocytes Relative: 41 %
Lymphs Abs: 2.2 10*3/uL (ref 0.7–4.0)
MCH: 28.5 pg (ref 26.0–34.0)
MCHC: 32.6 g/dL (ref 30.0–36.0)
MCV: 87.6 fL (ref 80.0–100.0)
Monocytes Absolute: 0.2 10*3/uL (ref 0.1–1.0)
Monocytes Relative: 5 %
Neutro Abs: 2.7 10*3/uL (ref 1.7–7.7)
Neutrophils Relative %: 51 %
Platelets: 273 10*3/uL (ref 150–400)
RBC: 3.96 MIL/uL (ref 3.87–5.11)
RDW: 12.4 % (ref 11.5–15.5)
WBC: 5.3 10*3/uL (ref 4.0–10.5)
nRBC: 0 % (ref 0.0–0.2)

## 2022-01-20 LAB — LIPASE, BLOOD: Lipase: 28 U/L (ref 11–51)

## 2022-01-20 MED ORDER — CIPROFLOXACIN HCL 500 MG PO TABS: 500.0000 mg | ORAL_TABLET | Freq: Two times a day (BID) | ORAL | 0 refills | Status: DC

## 2022-01-20 MED ORDER — BISACODYL 5 MG PO TBEC: 10.0000 mg | DELAYED_RELEASE_TABLET | Freq: Every day | ORAL | 0 refills | Status: AC | PRN

## 2022-01-20 MED ORDER — METRONIDAZOLE 500 MG PO TABS
500.0000 mg | ORAL_TABLET | Freq: Once | ORAL | Status: AC
  Administered 2022-01-20: 500 mg via ORAL
  Filled 2022-01-20: qty 1

## 2022-01-20 MED ORDER — HYDROCODONE-ACETAMINOPHEN 5-325 MG PO TABS: 1.0000 | ORAL_TABLET | Freq: Four times a day (QID) | ORAL | 0 refills | Status: DC | PRN

## 2022-01-20 MED ORDER — CIPROFLOXACIN HCL 500 MG PO TABS
500.0000 mg | ORAL_TABLET | Freq: Once | ORAL | Status: AC
  Administered 2022-01-20: 500 mg via ORAL
  Filled 2022-01-20: qty 1

## 2022-01-20 MED ORDER — METRONIDAZOLE 500 MG PO TABS: 500.0000 mg | ORAL_TABLET | Freq: Three times a day (TID) | ORAL | 0 refills | Status: DC

## 2022-01-20 MED ORDER — IOHEXOL 300 MG/ML  SOLN
100.0000 mL | Freq: Once | INTRAMUSCULAR | Status: AC | PRN
  Administered 2022-01-20: 100 mL via INTRAVENOUS

## 2022-01-20 MED ORDER — SODIUM CHLORIDE 0.9 % IV BOLUS
1000.0000 mL | Freq: Once | INTRAVENOUS | Status: AC
  Administered 2022-01-20: 1000 mL via INTRAVENOUS

## 2022-01-20 NOTE — ED Triage Notes (Signed)
C/o abdominal pain for the past few weeks. Hx of diverticulitis, feels the same.

## 2022-01-20 NOTE — ED Provider Notes (Signed)
[facility] EMERGENCY DEPARTMENT Provider Note   CSN: [id number] Arrival date & time: 01/20/22  4098     History  Chief Complaint  Patient presents with   Abdominal Pain    Jennifer Stafford is a 56 y.o. female.  Jennifer Stafford is a 56 y.o. female with a history of diverticulitis, who presents emergency department for evaluation of abdominal pain.  Patient reports she has been having lower abdominal pain over the past few weeks.  Reports it feels similar to her prior episode of diverticulitis, most recent episode was about 4 to 5 months ago.  She reports that she has been trying to use Tylenol and drink plenty of fluids and rest her bowels but pain has not gotten better.  She denies worsening of pain.  No associated fevers or chills, nausea or vomiting.  Did have 1 episode of blood in her stool about 2 days ago but blood has not continued.  Patient reports that she had a GI doctor in [place] but has not establish care locally since moving to [place] and has not had a colonoscopy.  No prior abdominal surgeries, no other aggravating or alleviating factors.  The history is provided by the patient and medical records.  Abdominal Pain Associated symptoms: no chest pain, no chills, no cough, no diarrhea, no dysuria, no fever, no nausea, no shortness of breath and no vomiting        Home Medications Prior to Admission medications   Medication Sig Start Date End Date Taking? Authorizing Provider  bisacodyl (DULCOLAX) 5 MG EC tablet Take 2 tablets (10 mg total) by mouth daily as needed for moderate constipation. 01/20/22  Yes Dartha Lodge, PA-C  ciprofloxacin (CIPRO) 500 MG tablet Take 1 tablet (500 mg total) by mouth 2 (two) times daily. 01/20/22  Yes Dartha Lodge, PA-C  HYDROcodone-acetaminophen (NORCO/VICODIN) 5-325 MG tablet Take 1 tablet by mouth every 6 (six) hours as needed for severe pain. 01/20/22  Yes Dartha Lodge, PA-C  metroNIDAZOLE (FLAGYL) 500 MG tablet Take 1  tablet (500 mg total) by mouth 3 (three) times daily for 7 days. 01/20/22 01/27/22 Yes Dartha Lodge, PA-C  acetaminophen (TYLENOL) 500 MG tablet Take 1,000 mg by mouth every 6 (six) hours as needed for moderate pain.    [provider]  aspirin-sod bicarb-citric acid (ALKA-SELTZER ORIGINAL) 325 MG TBEF tablet Take 325 mg by mouth every 6 (six) hours as needed (acid reflux).    [provider]  ondansetron (ZOFRAN) 4 MG tablet Take 1 tablet (4 mg total) by mouth every 8 (eight) hours as needed for nausea or vomiting. 09/29/18   Elson Areas, PA-C  traMADol (ULTRAM) 50 MG tablet Take 1 tablet (50 mg total) by mouth every 6 (six) hours as needed for severe pain. 09/29/18   Elson Areas, PA-C      Allergies    Patient has no known allergies.    Review of Systems   Review of Systems  Constitutional:  Negative for chills and fever.  HENT: Negative.    Respiratory:  Negative for cough and shortness of breath.   Cardiovascular:  Negative for chest pain.  Gastrointestinal:  Positive for abdominal pain and blood in stool. Negative for diarrhea, nausea and vomiting.  Genitourinary:  Negative for dysuria and frequency.  Musculoskeletal:  Negative for arthralgias and myalgias.  Skin:  Negative for color change and rash.  All other systems reviewed and are negative.   Physical Exam  Updated Vital Signs BP (!) 148/84 (BP Location: Right Arm)   Pulse 81   Temp 98.2 F (36.8 C) (Oral)   Resp 18   Ht 5\' 2"  (1.575 m)   Wt 90.7 kg   SpO2 97%   BMI 36.58 kg/m  Physical Exam Vitals and nursing note reviewed.  Constitutional:      General: She is not in acute distress.    Appearance: Normal appearance. She is well-developed. She is not ill-appearing or diaphoretic.  HENT:     Head: Normocephalic and atraumatic.     Mouth/Throat:     Mouth: Mucous membranes are moist.     Pharynx: Oropharynx is clear.  Eyes:     General:        Right eye: No discharge.        Left eye:  No discharge.     Pupils: Pupils are equal, round, and reactive to light.  Cardiovascular:     Rate and Rhythm: Normal rate and regular rhythm.     Pulses: Normal pulses.     Heart sounds: Normal heart sounds.  Pulmonary:     Effort: Pulmonary effort is normal. No respiratory distress.     Breath sounds: Normal breath sounds. No wheezing or rales.     Comments: Respirations equal and unlabored, patient able to speak in full sentences, lungs clear to auscultation bilaterally  Abdominal:     General: Bowel sounds are normal. There is no distension.     Palpations: Abdomen is soft. There is no mass.     Tenderness: There is abdominal tenderness in the suprapubic area and left lower quadrant.     Comments: Abdomen soft, nondistended, bowel sounds present throughout, there is tenderness to light and deep palpation in the left lower quadrant and suprapubic region, no right lower quadrant tenderness, no guarding or rebound tenderness.  Musculoskeletal:        General: No deformity.     Cervical back: Neck supple.  Skin:    General: Skin is warm and dry.     Capillary Refill: Capillary refill takes less than 2 seconds.  Neurological:     Mental Status: She is alert and oriented to person, place, and time.     Coordination: Coordination normal.     Comments: Speech is clear, able to follow commands Moves extremities without ataxia, coordination intact  Psychiatric:        Mood and Affect: Mood normal.        Behavior: Behavior normal.     ED Results / Procedures / Treatments   Labs (all labs ordered are listed, but only abnormal results are displayed) Labs Reviewed  CBC WITH DIFFERENTIAL/PLATELET - Abnormal; Notable for the following components:      Result Value   Hemoglobin 11.3 (*)    HCT 34.7 (*)    All other components within normal limits  COMPREHENSIVE METABOLIC PANEL  LIPASE, BLOOD  URINALYSIS, ROUTINE W REFLEX MICROSCOPIC    EKG None  Radiology CT Abdomen Pelvis W  Contrast  Result Date: 01/20/2022 CLINICAL DATA:  Left lower quadrant abdominal pain, history of diverticulitis. EXAM: CT ABDOMEN AND PELVIS WITH CONTRAST TECHNIQUE: Multidetector CT imaging of the abdomen and pelvis was performed using the standard protocol following bolus administration of intravenous contrast. RADIATION DOSE REDUCTION: This exam was performed according to the departmental dose-optimization program which includes automated exposure control, adjustment of the mA and/or kV according to patient size and/or use of iterative reconstruction technique. CONTRAST:  139mL  OMNIPAQUE IOHEXOL 300 MG/ML  SOLN COMPARISON:  CT examination dated February 24, 2021 FINDINGS: Lower chest: No acute abnormality. Hepatobiliary: No focal liver abnormality is seen. No gallstones, gallbladder wall thickening, or biliary dilatation. Pancreas: Unremarkable. No pancreatic ductal dilatation or surrounding inflammatory changes. Spleen: Normal in size without focal abnormality. Adrenals/Urinary Tract: Adrenal glands are unremarkable. Kidneys are normal, without renal calculi, focal lesion, or hydronephrosis. Bladder is unremarkable. Stomach/Bowel: Stomach is within normal limits. Appendix appears normal. Colonic diverticulosis prominent in the descending and sigmoid colon. There is focal sigmoid colonic wall thickening with adjacent fat stranding consistent with acute diverticulitis. No adjacent fluid collection or abscess. No extraluminal free air. Vascular/Lymphatic: No significant vascular findings are present. No enlarged abdominal or pelvic lymph nodes. Reproductive: Uterus and bilateral adnexa are unremarkable. Other: No abdominal wall hernia or abnormality. No abdominopelvic ascites. 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 No acute osseous abnormality. IMPRESSION: 1. Colonic diverticulosis with focal sigmoid colonic wall thickening and adjacent fat  stranding consistent with acute diverticulitis. No adjacent fluid collection or abscess. 2. No evidence of bowel obstruction. Normal appendix. 3. 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 Electronically Signed   By: Larose Hires D.O.   On: 01/20/2022 10:46    Procedures Procedures    Medications Ordered in ED Medications  sodium chloride 0.9 % bolus 1,000 mL (0 mLs Intravenous Stopped 01/20/22 1134)  iohexol (OMNIPAQUE) 300 MG/ML solution 100 mL (100 mLs Intravenous Contrast Given 01/20/22 1029)  ciprofloxacin (CIPRO) tablet 500 mg (500 mg Oral Given 01/20/22 1134)  metroNIDAZOLE (FLAGYL) tablet 500 mg (500 mg Oral Given 01/20/22 1134)    ED Course/ Medical Decision Making/ A&P                           Medical Decision Making Amount and/or Complexity of Data Reviewed Labs: ordered. Radiology: ordered.  Risk OTC drugs. Prescription drug management.   56 y.o. female presents to the ED with complaints of lower abdominal pain, this involves an extensive number of treatment options, and is a complaint that carries with it a high risk of complications and morbidity.  The differential diagnosis includes diverticulitis, colitis, appendicitis, urinary tract infection   On arrival pt is nontoxic, vitals WNL.   Additional history obtained from chart review. Previous records obtained and reviewed   Patient drove herself to the emergency department so we will avoid potentially drowsing pain medications but will give IV fluid bolus.  Lab Tests:  I Ordered, reviewed, and interpreted labs, which included: No leukocytosis, stable hemoglobin, no electrolyte derangements, normal renal and liver function, normal lipase, UA without any hematuria or signs of infection.  Imaging Studies ordered:  I ordered imaging studies which included CT abdomen pelvis, I independently visualized and interpreted imaging which showed evidence of sigmoid  diverticulitis without abscess or perforation.  ED Course:   Patient given oral antibiotics here in the emergency department, tolerating p.o. without difficulty.  Given uncomplicated diverticulitis on CT, stable vitals and labs feel patient is appropriate for outpatient treatment.  Diverticulitis most recently 4 to 5 months ago without complication.  Will treat with Cipro and Flagyl.  Prescribed short course of pain medication as well as bowel regimen.  Provided referral for GI follow-up with Dr. Bosie Clos.  Discussed strict return precautions.  At this time there does not appear to be any evidence of an acute  emergency medical condition requiring further emergent evaluation and the patient appears stable for discharge with appropriate outpatient follow up. Diagnosis and return precautions discussed with patient who verbalizes understanding and is agreeable to discharge.      Portions of this note were generated with [employer]. Dictation errors may occur despite best attempts at proofreading.         Final Clinical Impression(s) / ED Diagnoses Final diagnoses:  Diverticulitis    Rx / DC Orders ED Discharge Orders          Ordered    ciprofloxacin (CIPRO) 500 MG tablet  2 times daily        01/20/22 1302    metroNIDAZOLE (FLAGYL) 500 MG tablet  3 times daily        01/20/22 1302    HYDROcodone-acetaminophen (NORCO/VICODIN) 5-325 MG tablet  Every 6 hours PRN        01/20/22 1304    bisacodyl (DULCOLAX) 5 MG EC tablet  Daily PRN        01/20/22 1304              Jacqlyn Larsen, PA-C 01/20/22 1352    Audley Hose, MD 01/20/22 [phone]

## 2022-01-20 NOTE — Discharge Instructions (Addendum)
Take antibiotics as prescribed for the next 7 days to treat diverticulitis.  You may use prescribed pain medication as needed for severe pain for more mild pain you can use ibuprofen and/or Tylenol.  If you do have to use pain medication please make sure you are taking stool softeners daily as prescribed and if you are still having any trouble having regular bowel movements you can also take over-the-counter MiraLAX 1 capful daily.  Constipation can worsen the pain with diverticulitis and lead to involve recurrence of diverticulitis.  Please call to schedule follow-up appointment with GI.  If you develop worsening abdominal pain, fevers, vomiting or more blood in the stool please return for reevaluation.

## 2022-01-23 ENCOUNTER — Other Ambulatory Visit: Payer: Self-pay

## 2022-01-23 ENCOUNTER — Encounter (HOSPITAL_COMMUNITY): Payer: Self-pay

## 2022-01-23 ENCOUNTER — Encounter (HOSPITAL_BASED_OUTPATIENT_CLINIC_OR_DEPARTMENT_OTHER): Payer: Self-pay | Admitting: Emergency Medicine

## 2022-01-23 ENCOUNTER — Emergency Department (HOSPITAL_BASED_OUTPATIENT_CLINIC_OR_DEPARTMENT_OTHER): Payer: BLUE CROSS/BLUE SHIELD

## 2022-01-23 ENCOUNTER — Observation Stay (HOSPITAL_BASED_OUTPATIENT_CLINIC_OR_DEPARTMENT_OTHER)
Admission: EM | Admit: 2022-01-23 | Discharge: 2022-01-25 | Disposition: A | Discharge status: Home / Self Care | Payer: BLUE CROSS/BLUE SHIELD | Attending: Emergency Medicine | Admitting: Emergency Medicine

## 2022-01-23 DIAGNOSIS — Z79899 Other long term (current) drug therapy: Secondary | ICD-10-CM | POA: Diagnosis not present

## 2022-01-23 DIAGNOSIS — Z6836 Body mass index (BMI) 36.0-36.9, adult: Secondary | ICD-10-CM | POA: Insufficient documentation

## 2022-01-23 DIAGNOSIS — K5732 Diverticulitis of large intestine without perforation or abscess without bleeding: Principal | ICD-10-CM | POA: Insufficient documentation

## 2022-01-23 DIAGNOSIS — K5792 Diverticulitis of intestine, part unspecified, without perforation or abscess without bleeding: Secondary | ICD-10-CM | POA: Diagnosis not present

## 2022-01-23 DIAGNOSIS — E6609 Other obesity due to excess calories: Secondary | ICD-10-CM | POA: Insufficient documentation

## 2022-01-23 DIAGNOSIS — R112 Nausea with vomiting, unspecified: Secondary | ICD-10-CM | POA: Diagnosis present

## 2022-01-23 DIAGNOSIS — Z7982 Long term (current) use of aspirin: Secondary | ICD-10-CM | POA: Insufficient documentation

## 2022-01-23 LAB — CBC WITH DIFFERENTIAL/PLATELET
Abs Immature Granulocytes: 0.01 10*3/uL (ref 0.00–0.07)
Basophils Absolute: 0 10*3/uL (ref 0.0–0.1)
Basophils Relative: 0 %
Eosinophils Absolute: 0.1 10*3/uL (ref 0.0–0.5)
Eosinophils Relative: 1 %
HCT: 34.7 % — ABNORMAL LOW (ref 36.0–46.0)
Hemoglobin: 11.3 g/dL — ABNORMAL LOW (ref 12.0–15.0)
Immature Granulocytes: 0 %
Lymphocytes Relative: 26 %
Lymphs Abs: 1.7 10*3/uL (ref 0.7–4.0)
MCH: 28.5 pg (ref 26.0–34.0)
MCHC: 32.6 g/dL (ref 30.0–36.0)
MCV: 87.4 fL (ref 80.0–100.0)
Monocytes Absolute: 0.3 10*3/uL (ref 0.1–1.0)
Monocytes Relative: 5 %
Neutro Abs: 4.4 10*3/uL (ref 1.7–7.7)
Neutrophils Relative %: 68 %
Platelets: 265 10*3/uL (ref 150–400)
RBC: 3.97 MIL/uL (ref 3.87–5.11)
RDW: 12 % (ref 11.5–15.5)
WBC: 6.5 10*3/uL (ref 4.0–10.5)
nRBC: 0 % (ref 0.0–0.2)

## 2022-01-23 LAB — COMPREHENSIVE METABOLIC PANEL
ALT: 14 U/L (ref 0–44)
AST: 18 U/L (ref 15–41)
Albumin: 3.5 g/dL (ref 3.5–5.0)
Alkaline Phosphatase: 57 U/L (ref 38–126)
Anion gap: 7 (ref 5–15)
BUN: 9 mg/dL (ref 6–20)
CO2: 26 mmol/L (ref 22–32)
Calcium: 8.8 mg/dL — ABNORMAL LOW (ref 8.9–10.3)
Chloride: 101 mmol/L (ref 98–111)
Creatinine, Ser: 0.71 mg/dL (ref 0.44–1.00)
GFR, Estimated: >60 mL/min (ref >60)
Glucose, Bld: 97 mg/dL (ref 70–99)
Potassium: 4 mmol/L (ref 3.5–5.1)
Sodium: 134 mmol/L — ABNORMAL LOW (ref 135–145)
Total Bilirubin: 0.7 mg/dL (ref 0.3–1.2)
Total Protein: 7.4 g/dL (ref 6.5–8.1)

## 2022-01-23 LAB — URINALYSIS, ROUTINE W REFLEX MICROSCOPIC
Bilirubin Urine: NEGATIVE
Glucose, UA: NEGATIVE mg/dL
Hgb urine dipstick: NEGATIVE
Ketones, ur: NEGATIVE mg/dL
Leukocytes,Ua: NEGATIVE
Nitrite: NEGATIVE
Protein, ur: NEGATIVE mg/dL
Specific Gravity, Urine: 1.025 (ref 1.005–1.030)
pH: 7 (ref 5.0–8.0)

## 2022-01-23 LAB — LIPASE, BLOOD: Lipase: 26 U/L (ref 11–51)

## 2022-01-23 LAB — LACTIC ACID, PLASMA: Lactic Acid, Venous: 0.7 mmol/L (ref 0.5–1.9)

## 2022-01-23 LAB — HIV ANTIBODY (ROUTINE TESTING W REFLEX): HIV Screen 4th Generation wRfx: NONREACTIVE

## 2022-01-23 MED ORDER — ONDANSETRON 4 MG PO TBDP: 4.0000 mg | ORAL_TABLET | Freq: Four times a day (QID) | ORAL | Status: DC | PRN

## 2022-01-23 MED ORDER — PIPERACILLIN-TAZOBACTAM 3.375 G IVPB 30 MIN
3.3750 g | Freq: Once | INTRAVENOUS | Status: AC
  Administered 2022-01-23: 3.375 g via INTRAVENOUS
  Filled 2022-01-23: qty 50

## 2022-01-23 MED ORDER — SODIUM CHLORIDE 0.9 % IV BOLUS
500.0000 mL | Freq: Once | INTRAVENOUS | Status: AC
  Administered 2022-01-23: 500 mL via INTRAVENOUS

## 2022-01-23 MED ORDER — FENTANYL CITRATE PF 50 MCG/ML IJ SOSY
50.0000 ug | PREFILLED_SYRINGE | Freq: Once | INTRAMUSCULAR | Status: AC
  Administered 2022-01-23: 50 ug via INTRAVENOUS
  Filled 2022-01-23: qty 1

## 2022-01-23 MED ORDER — MORPHINE SULFATE (PF) 4 MG/ML IV SOLN
4.0000 mg | Freq: Once | INTRAVENOUS | Status: AC
  Administered 2022-01-23: 4 mg via INTRAVENOUS
  Filled 2022-01-23: qty 1

## 2022-01-23 MED ORDER — ONDANSETRON HCL 4 MG/2ML IJ SOLN
4.0000 mg | Freq: Four times a day (QID) | INTRAMUSCULAR | Status: DC | PRN
  Administered 2022-01-23: 4 mg via INTRAVENOUS
  Filled 2022-01-23 (×2): qty 2

## 2022-01-23 MED ORDER — IOHEXOL 300 MG/ML  SOLN
100.0000 mL | Freq: Once | INTRAMUSCULAR | Status: AC | PRN
  Administered 2022-01-23: 100 mL via INTRAVENOUS

## 2022-01-23 MED ORDER — ACETAMINOPHEN 650 MG RE SUPP: 650.0000 mg | Freq: Four times a day (QID) | RECTAL | Status: DC | PRN

## 2022-01-23 MED ORDER — HYDRALAZINE HCL 20 MG/ML IJ SOLN: 10.0000 mg | INTRAMUSCULAR | Status: DC | PRN

## 2022-01-23 MED ORDER — LACTATED RINGERS IV SOLN: INTRAVENOUS | Status: DC

## 2022-01-23 MED ORDER — PIPERACILLIN-TAZOBACTAM 3.375 G IVPB
3.3750 g | Freq: Three times a day (TID) | INTRAVENOUS | Status: DC
  Administered 2022-01-24 – 2022-01-25 (×4): 3.375 g via INTRAVENOUS
  Filled 2022-01-23 (×4): qty 50

## 2022-01-23 MED ORDER — MORPHINE SULFATE (PF) 2 MG/ML IV SOLN
2.0000 mg | INTRAVENOUS | Status: DC | PRN
  Administered 2022-01-24 (×4): 2 mg via INTRAVENOUS
  Filled 2022-01-23 (×4): qty 1

## 2022-01-23 MED ORDER — ACETAMINOPHEN 325 MG PO TABS: 650.0000 mg | ORAL_TABLET | Freq: Four times a day (QID) | ORAL | Status: DC | PRN

## 2022-01-23 NOTE — ED Triage Notes (Signed)
Pt sts she was here Thurs, dx w/ diverticulitis; she cannot keep abx down and is still in pain

## 2022-01-23 NOTE — ED Notes (Signed)
Patient denies pain when laying down and not moving around, will hold off pain meds at the moment,

## 2022-01-23 NOTE — Progress Notes (Signed)
Pharmacy Antibiotic Note  Breana Litts is a 56 y.o. female admitted on 01/23/2022 with Diverticulitis.  Pharmacy has been consulted for Zosyn dosing.  CrCl 80-90 mL/min  Plan: Zosyn 3.375g Q8H Monitor renal function for LOT     Temp (24hrs), Avg:98.6 F (37 C), Min:98.6 F (37 C), Max:98.6 F (37 C)  Recent Labs  Lab 01/20/22 0927 01/23/22 1338 01/23/22 1408  WBC 5.3 6.5  --   CREATININE 0.67  --  0.71  LATICACIDVEN  --  0.7  --     Estimated Creatinine Clearance: 84.1 mL/min (by C-G formula based on SCr of 0.71 mg/dL).    No Known Allergies  Antimicrobials this admission: Zosyn 12/10 >>   Thank you for allowing pharmacy to be a part of this patient's care.  Ellis Savage, PharmD Clinical Pharmacist 01/23/2022 5:24 PM

## 2022-01-23 NOTE — Progress Notes (Addendum)
Plan of Care Note for accepted transfer   Patient: Jennifer Stafford MRN: [id number]   DOA: 01/23/2022  Facility requesting transfer: [facility] Requesting Provider: Lockie Mola Reason for transfer: Worsening abdominal symptoms  Facility course: Patient with h/o diverticulitis presenting with worsening pain and n/v; she was seen for this on 12/7 and discharged with Cipro/Flagyl as well as Norco for pain control.  Diagnosed 3 days ago, and taking meds but no better with pain and now unable to tolerate PO.  WBC ok, negative lactate.  CT with ?worsening.  Given fentanyl, morphine, Zosyn.  Failed outpatient therapy.     Plan of care: The patient is accepted for admission to Med-surg  unit, at [facility] or [facility].   Author: Jonah Blue, MD 01/23/2022  Check [email or web address] for on-call coverage.  Nursing staff, Please call [facility] Admits & Consults System-Wide number on Amion as soon as patient's arrival, so appropriate admitting provider can evaluate the pt.

## 2022-01-23 NOTE — Assessment & Plan Note (Addendum)
BMI 36.5 -PCP follow up: consider discussing medication assisted wt loss with Reginal Lutes or Zepbound as outpatient

## 2022-01-23 NOTE — Assessment & Plan Note (Addendum)
-  Patient's symptoms are c/w diverticulitis and her CT supports this as a diagnosis -No SIRS criteria  -She was treated with outpatient Cipro/Flagyl but was unable to tolerate and so has failed outpatient therapy -Will admit to med surg for ongoing treatment -For now, will give bowel rest, IVF, pain medication with morphine, nausea medication with Zofran, and treat with Zosyn for intraabdominal infection -If not improving, she may need GI/surgery consultation

## 2022-01-23 NOTE — ED Provider Notes (Signed)
[facility] EMERGENCY DEPARTMENT Provider Note   CSN: [id number] Arrival date & time: 01/23/22  1236     History Diverticulitis Chief Complaint  Patient presents with   Abdominal Pain    Jennifer Stafford is a 56 y.o. female.  56 year old with a past medical history of diverticulitis presents to the ED with a chief complaint of worsening symptoms.  Patient was diagnosed with diverticulitis 3 days ago, she was sent home on Cipro along with Flagyl along with Norco for pain control.  She reports today she had 3 episodes of nonbilious, nonbloody emesis.  She reports she is not able to keep her antibiotics down.  She is having severe pain to the left lower quadrant, describing it as a sharp stabbing sensation.  She did take a stool softener yesterday and had a large bowel movement which was very painful.  She has been taking Tylenol to help with pain instead of her Norco as she was unsure whether this was making her feel worse.  She has not been running any fevers, no urinary symptoms, no other complaints.   The history is provided by the patient.  Abdominal Pain Pain location:  LLQ Pain quality: aching   Pain radiates to:  Does not radiate Pain severity:  Moderate Onset quality:  Gradual Duration:  3 days Timing:  Constant Progression:  Worsening Chronicity:  Recurrent Relieved by:  Nothing Associated symptoms: nausea and vomiting   Associated symptoms: no chest pain, no chills, no constipation, no fever, no shortness of breath and no sore throat        Home Medications Prior to Admission medications   Medication Sig Start Date End Date Taking? Authorizing Provider  acetaminophen (TYLENOL) 500 MG tablet Take 1,000 mg by mouth every 6 (six) hours as needed for moderate pain.    [provider]  aspirin-sod bicarb-citric acid (ALKA-SELTZER ORIGINAL) 325 MG TBEF tablet Take 325 mg by mouth every 6 (six) hours as needed (acid reflux).    [provider]   bisacodyl (DULCOLAX) 5 MG EC tablet Take 2 tablets (10 mg total) by mouth daily as needed for moderate constipation. 01/20/22   Dartha Lodge, PA-C  ciprofloxacin (CIPRO) 500 MG tablet Take 1 tablet (500 mg total) by mouth 2 (two) times daily. 01/20/22   Dartha Lodge, PA-C  HYDROcodone-acetaminophen (NORCO/VICODIN) 5-325 MG tablet Take 1 tablet by mouth every 6 (six) hours as needed for severe pain. 01/20/22   Dartha Lodge, PA-C  metroNIDAZOLE (FLAGYL) 500 MG tablet Take 1 tablet (500 mg total) by mouth 3 (three) times daily for 7 days. 01/20/22 01/27/22  Dartha Lodge, PA-C  ondansetron (ZOFRAN) 4 MG tablet Take 1 tablet (4 mg total) by mouth every 8 (eight) hours as needed for nausea or vomiting. 09/29/18   Elson Areas, PA-C  traMADol (ULTRAM) 50 MG tablet Take 1 tablet (50 mg total) by mouth every 6 (six) hours as needed for severe pain. 09/29/18   Elson Areas, PA-C      Allergies    Patient has no known allergies.    Review of Systems   Review of Systems  Constitutional:  Negative for chills and fever.  HENT:  Negative for sore throat.   Respiratory:  Negative for shortness of breath.   Cardiovascular:  Negative for chest pain.  Gastrointestinal:  Positive for abdominal pain, nausea and vomiting. Negative for blood in stool and constipation.  Genitourinary:  Negative for flank pain.  Musculoskeletal:  Negative for back pain.  Neurological:  Negative for headaches.  All other systems reviewed and are negative.   Physical Exam Updated Vital Signs BP 138/80   Pulse 71   Temp 98.6 F (37 C) (Oral)   Resp 16   SpO2 96%  Physical Exam Vitals and nursing note reviewed.  Constitutional:      General: She is not in acute distress.    Appearance: She is well-developed.  HENT:     Head: Normocephalic and atraumatic.     Mouth/Throat:     Pharynx: No oropharyngeal exudate.  Eyes:     Pupils: Pupils are equal, round, and reactive to light.  Cardiovascular:     Rate and  Rhythm: Regular rhythm.     Heart sounds: Normal heart sounds.  Pulmonary:     Effort: Pulmonary effort is normal. No respiratory distress.     Breath sounds: Normal breath sounds.  Abdominal:     General: Bowel sounds are normal. There is no distension.     Palpations: Abdomen is soft.     Tenderness: There is abdominal tenderness in the left lower quadrant. There is no guarding or rebound.     Hernia: No hernia is present.  Musculoskeletal:        General: No tenderness or deformity.     Cervical back: Normal range of motion.     Right lower leg: No edema.     Left lower leg: No edema.  Skin:    General: Skin is warm and dry.  Neurological:     Mental Status: She is alert and oriented to person, place, and time.     ED Results / Procedures / Treatments   Labs (all labs ordered are listed, but only abnormal results are displayed) Labs Reviewed  CBC WITH DIFFERENTIAL/PLATELET - Abnormal; Notable for the following components:      Result Value   Hemoglobin 11.3 (*)    HCT 34.7 (*)    All other components within normal limits  URINALYSIS, ROUTINE W REFLEX MICROSCOPIC - Abnormal; Notable for the following components:   APPearance HAZY (*)    All other components within normal limits  COMPREHENSIVE METABOLIC PANEL - Abnormal; Notable for the following components:   Sodium 134 (*)    Calcium 8.8 (*)    All other components within normal limits  LACTIC ACID, PLASMA  LIPASE, BLOOD    EKG None  Radiology CT ABDOMEN PELVIS W CONTRAST  Result Date: 01/23/2022 CLINICAL DATA:  Left lower quadrant abdominal pain, diverticulitis, persistent pain following antibiotic treatment EXAM: CT ABDOMEN AND PELVIS WITH CONTRAST TECHNIQUE: Multidetector CT imaging of the abdomen and pelvis was performed using the standard protocol following bolus administration of intravenous contrast. RADIATION DOSE REDUCTION: This exam was performed according to the departmental dose-optimization program  which includes automated exposure control, adjustment of the mA and/or kV according to patient size and/or use of iterative reconstruction technique. CONTRAST:  121mL OMNIPAQUE IOHEXOL 300 MG/ML  SOLN COMPARISON:  01/20/2022 FINDINGS: Lower chest: No acute abnormality. Hepatobiliary: No solid liver abnormality is seen. No gallstones, gallbladder wall thickening, or biliary dilatation. Pancreas: Unremarkable. No pancreatic ductal dilatation or surrounding inflammatory changes. Spleen: Normal in size without significant abnormality. Adrenals/Urinary Tract: Adrenal glands are unremarkable. Kidneys are normal, without renal calculi, solid lesion, or hydronephrosis. Bladder is unremarkable. Stomach/Bowel: Stomach is within normal limits. Appendix appears normal. Pancolonic diverticulosis, severe in the descending and sigmoid colon, with redemonstrated wall thickening of the sigmoid colon with adjacent  fat stranding and small volume of reactive fluid in the left paracolic gutter (series 2, image 64). Wall thickening of the distal sigmoid colon is worsened compared to prior examination (series 5, image 57). Vascular/Lymphatic: No significant vascular findings are present. No enlarged abdominal or pelvic lymph nodes. Reproductive: Uterine fibroid. Other: No abdominal wall hernia or abnormality. No ascites. Musculoskeletal: No acute or significant osseous findings. IMPRESSION: Colonic diverticulosis with redemonstrated wall thickening of the sigmoid colon as well as adjacent fat stranding and small volume of reactive fluid in the left paracolic gutter. Wall thickening of the distal sigmoid colon is worsened compared to prior examination. Findings remain consistent with acute diverticulitis. No evidence of complicating perforation or abscess at this time. Electronically Signed   By: Delanna Ahmadi M.D.   On: 01/23/2022 15:36    Procedures Procedures    Medications Ordered in ED Medications  morphine (PF) 4 MG/ML  injection 4 mg (has no administration in time range)  piperacillin-tazobactam (ZOSYN) IVPB 3.375 g (has no administration in time range)  fentaNYL (SUBLIMAZE) injection 50 mcg (50 mcg Intravenous Given 01/23/22 1437)  sodium chloride 0.9 % bolus 500 mL (500 mLs Intravenous New Bag/Given 01/23/22 1404)  iohexol (OMNIPAQUE) 300 MG/ML solution 100 mL (100 mLs Intravenous Contrast Given 01/23/22 1510)    ED Course/ Medical Decision Making/ A&P                           Medical Decision Making Amount and/or Complexity of Data Reviewed Labs: ordered. Radiology: ordered.  Risk Prescription drug management.   This patient presents to the ED for concern of nausea, vomiting, worsening abdominal pain, this involves a number of treatment options, and is a complaint that carries with it a high  risk of complications and morbidity.  The differential diagnosis includes beginning of diverticulitis such as perforation, versus abscess.    Co morbidities: Discussed in HPI   Brief History:  Patient here with lower abdominal pain which began approximately 3 days ago with her initial diagnosis diverticulitis.  Here with worsening symptoms nausea and vomiting, unable to keep her antibiotics down.  Has tried some Tylenol for pain control along with Norco without much improvement in symptoms.  EMR reviewed including pt PMHx, past surgical history and past visits to ER.   See HPI for more details  Lab Tests:  I ordered and independently interpreted labs.  The pertinent results include:    I personally reviewed all laboratory work and imaging. Metabolic panel without any acute abnormality specifically kidney function within normal limits and no significant electrolyte abnormalities. CBC without leukocytosis or significant anemia.   Imaging Studies:  CT Abdomen and pelvis showed: IMPRESSION:  Colonic diverticulosis with redemonstrated wall thickening of the  sigmoid colon as well as adjacent fat  stranding and small volume of  reactive fluid in the left paracolic gutter. Wall thickening of the  distal sigmoid colon is worsened compared to prior examination.  Findings remain consistent with acute diverticulitis. No evidence of  complicating perforation or abscess at this time.    Medicines ordered:  I ordered medication including fentnayl  for pain control Reevaluation of the patient after these medicines showed that the patient improved I have reviewed the patients home medicines and have made adjustments as needed  Reevaluation:  After the interventions noted above I re-evaluated patient and found that they have :improved   Social Determinants of Health:  The patient's social determinants of health were  a factor in the care of this patient  Problem List / ED Course:  Patient here with abdominal pain after dx of diverticulitis 3 days ago, sent home on Cipro, Flagyl, Norco without any improvement in symptoms.  Reports worsening pain to the left lower quadrant.  She did have an episode of vomiting this morning, reports she is not feeling any improvement after taking the antibiotics along with pain medication for the past 3 days.  Significant pain along the left lower quadrant. Labs on today's visit without any leukocytosis, electrolytes are within normal limits despite multiple episodes of vomiting.  Lactic acid is negative.  She was given fentanyl, bolus to help with her symptoms.  After several consideration, I did discuss with patient if her pain is likely worsening we are worried about perforation versus abscess.  A CT of her abdomen was also ordered.  She was given morphine to help with pain control. CT with some worsening reactive fluid and distal colon, she continues to have pain, second round of pain medication given. She continues to endorse pain and is not tolerating her PO meds, I do feel is reasonable for admission after fail of abx therapy.  Spoke to hospitalist Dr.  Lorin Mercy, who will admit patient for further management of pain control and diverticulitis treatment.   Dispostion:  After consideration of the diagnostic results and the patients response to treatment, I feel that the patent would benefit from admission and treatment of diverticulitis with IV antibiotics.     Portions of this note were generated with L[employer]. Dictation errors may occur despite best attempts at proofreading.   Final Clinical Impression(s) / ED Diagnoses Final diagnoses:  Nausea and vomiting, unspecified vomiting type  Diverticulitis    Rx / DC Orders ED Discharge Orders     None         Janeece Fitting, PA-C 01/23/22 [place], [place], DO 01/26/22 [phone]

## 2022-01-23 NOTE — H&P (Signed)
History and Physical    Patient: Jennifer Stafford ACZ:[id number] DOB: [birth year] DOA: 01/23/2022 DOS: the patient was seen and examined on 01/23/2022 PCP: Pcp, No  Patient coming from: Home  Chief Complaint:  Chief Complaint  Patient presents with   Abdominal Pain   HPI: Jennifer Stafford is a 56 y.o. female with medical history significant of diverticulitis presenting with worsening pain and n/v; she was seen for this on 12/7 and discharged with Cipro/Flagyl as well as Norco for pain control   She came Thursday but the medication made her feel worse.  She has been having n/v with taking the medication, none while not taking the medication.  Pain is LLQ.  She is having normal BMs, severe pain with BM.  She has not had fever.  She has had diverticulitis in the past but this time it was worse.    Review of Systems: As mentioned in the history of present illness. All other systems reviewed and are negative. Past Medical History:  Diagnosis Date   Diverticulitis    Past Surgical History:  Procedure Laterality Date   CESAREAN SECTION     Social History:  reports that she has never smoked. She has never used smokeless tobacco. She reports current alcohol use. She reports that she does not use drugs.  No Known Allergies  History reviewed. No pertinent family history.  Prior to Admission medications   Medication Sig Start Date End Date Taking? Authorizing Provider  acetaminophen (TYLENOL) 500 MG tablet Take 1,000 mg by mouth every 6 (six) hours as needed for moderate pain.    [provider]  aspirin-sod bicarb-citric acid (ALKA-SELTZER ORIGINAL) 325 MG TBEF tablet Take 325 mg by mouth every 6 (six) hours as needed (acid reflux).    [provider]  bisacodyl (DULCOLAX) 5 MG EC tablet Take 2 tablets (10 mg total) by mouth daily as needed for moderate constipation. 01/20/22   Dartha Lodge, PA-C  ciprofloxacin (CIPRO) 500 MG tablet Take 1 tablet (500 mg total) by mouth 2  (two) times daily. 01/20/22   Dartha Lodge, PA-C  HYDROcodone-acetaminophen (NORCO/VICODIN) 5-325 MG tablet Take 1 tablet by mouth every 6 (six) hours as needed for severe pain. 01/20/22   Dartha Lodge, PA-C  metroNIDAZOLE (FLAGYL) 500 MG tablet Take 1 tablet (500 mg total) by mouth 3 (three) times daily for 7 days. 01/20/22 01/27/22  Dartha Lodge, PA-C    Physical Exam: Vitals:   01/23/22 1852 01/23/22 2000 01/23/22 2020 01/23/22 2142  BP:  130/78  124/63  Pulse:  84  67  Resp:   16 18  Temp: 99.1 F (37.3 C)  98.6 F (37 C) 99.1 F (37.3 C)  TempSrc: Oral  Oral Oral  SpO2:  92%  100%   Constitutional: NAD, calm, comfortable Eyes: PERRL, lids and conjunctivae normal ENMT: Mucous membranes are moist. Posterior pharynx clear of any exudate or lesions.Normal dentition.  Neck: normal, supple, no masses, no thyromegaly Respiratory: clear to auscultation bilaterally, no wheezing, no crackles. Normal respiratory effort. No accessory muscle use.  Cardiovascular: Regular rate and rhythm, no murmurs / rubs / gallops. No extremity edema. 2+ pedal pulses. No carotid bruits.  Abdomen: LLQ TTP Musculoskeletal: no clubbing / cyanosis. No joint deformity upper and lower extremities. Good ROM, no contractures. Normal muscle tone.  Skin: no rashes, lesions, ulcers. No induration Neurologic: CN 2-12 grossly intact. Sensation intact, DTR normal. Strength 5/5 in all 4.  Psychiatric: Normal judgment and insight. Alert  and oriented x 3. Normal mood.   Data Reviewed:    CT AP: IMPRESSION: Colonic diverticulosis with redemonstrated wall thickening of the sigmoid colon as well as adjacent fat stranding and small volume of reactive fluid in the left paracolic gutter. Wall thickening of the distal sigmoid colon is worsened compared to prior examination. Findings remain consistent with acute diverticulitis. No evidence of complicating perforation or abscess at this time.  Assessment and Plan: *  Diverticulitis -Patient's symptoms are c/w diverticulitis and her CT supports this as a diagnosis -No SIRS criteria  -She was treated with outpatient Cipro/Flagyl but was unable to tolerate and so has failed outpatient therapy -Will admit to med surg for ongoing treatment -For now, will give bowel rest, IVF, pain medication with morphine, nausea medication with Zofran, and treat with Zosyn for intraabdominal infection -If not improving, she may need GI/surgery consultation  Class 2 obesity due to excess calories with body mass index (BMI) of 36.0 to 36.9 in adult BMI 36.5 -PCP follow up: consider discussing medication assisted wt loss with Reginal Lutes or Zepbound as outpatient      Advance Care Planning:   Code Status: Full Code  Consults: None  Family Communication: No family in room  Severity of Illness: The appropriate patient status for this patient is INPATIENT. Inpatient status is judged to be reasonable and necessary in order to provide the required intensity of service to ensure the patient's safety. The patient's presenting symptoms, physical exam findings, and initial radiographic and laboratory data in the context of their chronic comorbidities is felt to place them at high risk for further clinical deterioration. Furthermore, it is not anticipated that the patient will be medically stable for discharge from the hospital within 2 midnights of admission.   * I certify that at the point of admission it is my clinical judgment that the patient will require inpatient hospital care spanning beyond 2 midnights from the point of admission due to high intensity of service, high risk for further deterioration and high frequency of surveillance required.*  Author: Hillary Bow., DO 01/23/2022 9:48 PM  For on call review [email or web address].

## 2022-01-23 NOTE — Consult Note (Signed)
Initial Consultation Note - telemedicine visit performed with assistance from RN   Patient: Jennifer Stafford AJO:[id number] DOB: Jun 03, 1965 PCP: Pcp, No DOA: 01/23/2022 DOS: the patient was seen and examined on 01/23/2022 Primary service: Virgina Norfolk, DO  Referring physician: Lockie Mola Reason for consult: Diagnosed 3 days ago with diverticulitis, and taking meds but no better with pain and now unable to tolerate PO. WBC ok, negative lactate. CT with ?worsening. Given fentanyl, morphine, Zosyn. Failed outpatient therapy.    Assessment and Plan: * Diverticulitis -Patient's symptoms are c/w diverticulitis and her CT supports this as a diagnosis -No SIRS criteria  -She was treated with outpatient Cipro/Flagyl but was unable to tolerate and so has failed outpatient therapy -Will admit to med surg for ongoing treatment -For now, will give bowel rest, IVF, pain medication with morphine, nausea medication with Zofran, and treat with Zosyn for intraabdominal infection -If not improving, she may need GI/surgery consultation  Class 2 obesity due to excess calories with body mass index (BMI) of 36.0 to 36.9 in adult -There is no height or weight on file to calculate BMI..  -Weight loss should be encouraged -Outpatient PCP/bariatric medicine f/u encouraged      Patient was seen in consultation via televisit while she was at [facility].  She will need H&P and medication reconciliation upon arrival at destination hospital.    HPI: Jennifer Stafford is a 56 y.o. female with past medical history of diverticulitis presenting with worsening pain and n/v; she was seen for this on 12/7 and discharged with Cipro/Flagyl as well as Norco for pain control   She came Thursday but the medication made her feel worse.  She has been having n/v with taking the medication, none while not taking the medication.  Pain is LLQ.  She is having normal BMs, severe pain with BM.  She has not had fever.  She has had diverticulitis  in the past but this time it was worse.    Review of Systems: As mentioned in the history of present illness. All other systems reviewed and are negative. Past Medical History:  Diagnosis Date   Diverticulitis    Past Surgical History:  Procedure Laterality Date   CESAREAN SECTION     Social History:  reports that she has never smoked. She has never used smokeless tobacco. She reports current alcohol use. She reports that she does not use drugs.  No Known Allergies  History reviewed. No pertinent family history.  Prior to Admission medications   Medication Sig Start Date End Date Taking? Authorizing Provider  acetaminophen (TYLENOL) 500 MG tablet Take 1,000 mg by mouth every 6 (six) hours as needed for moderate pain.    [provider]  aspirin-sod bicarb-citric acid (ALKA-SELTZER ORIGINAL) 325 MG TBEF tablet Take 325 mg by mouth every 6 (six) hours as needed (acid reflux).    [provider]  bisacodyl (DULCOLAX) 5 MG EC tablet Take 2 tablets (10 mg total) by mouth daily as needed for moderate constipation. 01/20/22   Dartha Lodge, PA-C  ciprofloxacin (CIPRO) 500 MG tablet Take 1 tablet (500 mg total) by mouth 2 (two) times daily. 01/20/22   Dartha Lodge, PA-C  HYDROcodone-acetaminophen (NORCO/VICODIN) 5-325 MG tablet Take 1 tablet by mouth every 6 (six) hours as needed for severe pain. 01/20/22   Dartha Lodge, PA-C  metroNIDAZOLE (FLAGYL) 500 MG tablet Take 1 tablet (500 mg total) by mouth 3 (three) times daily for 7 days. 01/20/22 01/27/22  Jodi Geralds  N, PA-C  ondansetron (ZOFRAN) 4 MG tablet Take 1 tablet (4 mg total) by mouth every 8 (eight) hours as needed for nausea or vomiting. 09/29/18   Elson Areas, PA-C  traMADol (ULTRAM) 50 MG tablet Take 1 tablet (50 mg total) by mouth every 6 (six) hours as needed for severe pain. 09/29/18   Elson Areas, PA-C    Physical Exam: Vitals:   01/23/22 1405 01/23/22 1500 01/23/22 1600 01/23/22 1700  BP: 131/76 138/80  (!) 150/82 (!) 147/110  Pulse: 72 71 76 83  Resp: 18 16 15 17   Temp:      TempSrc:      SpO2: 98% 96% 98% 100%   Virtual visit conducted with assistance from RN:  General:  Appears calm and comfortable and is in NAD Eyes:   EOMI, normal lids, iris ENT:  grossly normal hearing, lips & tongue, mmm; appropriate dentition Neck:  no LAD, masses or thyromegaly Cardiovascular:  RRR - per RN Respiratory:   CTA bilaterally with no wheezes/rales/rhonchi - per RN.  Normal respiratory effort. Abdomen:  soft, LLQ TTP, ND, hypoactive bowel sounds Skin:  no rash or induration seen on limited exam Musculoskeletal:  grossly normal tone BUE/BLE, good ROM, no bony abnormality Psychiatric:  grossly normal mood and affect, speech fluent and appropriate, AOx3 Neurologic:  CN 2-12 grossly intact, moves all extremities in coordinated fashion, sensation intact   Radiological Exams on Admission: Independently reviewed - see discussion in A/P where applicable  CT ABDOMEN PELVIS W CONTRAST  Result Date: 01/23/2022 CLINICAL DATA:  Left lower quadrant abdominal pain, diverticulitis, persistent pain following antibiotic treatment EXAM: CT ABDOMEN AND PELVIS WITH CONTRAST TECHNIQUE: Multidetector CT imaging of the abdomen and pelvis was performed using the standard protocol following bolus administration of intravenous contrast. RADIATION DOSE REDUCTION: This exam was performed according to the departmental dose-optimization program which includes automated exposure control, adjustment of the mA and/or kV according to patient size and/or use of iterative reconstruction technique. CONTRAST:  14/11/2021 OMNIPAQUE IOHEXOL 300 MG/ML  SOLN COMPARISON:  01/20/2022 FINDINGS: Lower chest: No acute abnormality. Hepatobiliary: No solid liver abnormality is seen. No gallstones, gallbladder wall thickening, or biliary dilatation. Pancreas: Unremarkable. No pancreatic ductal dilatation or surrounding inflammatory changes. Spleen: Normal  in size without significant abnormality. Adrenals/Urinary Tract: Adrenal glands are unremarkable. Kidneys are normal, without renal calculi, solid lesion, or hydronephrosis. Bladder is unremarkable. Stomach/Bowel: Stomach is within normal limits. Appendix appears normal. Pancolonic diverticulosis, severe in the descending and sigmoid colon, with redemonstrated wall thickening of the sigmoid colon with adjacent fat stranding and small volume of reactive fluid in the left paracolic gutter (series 2, image 64). Wall thickening of the distal sigmoid colon is worsened compared to prior examination (series 5, image 57). Vascular/Lymphatic: No significant vascular findings are present. No enlarged abdominal or pelvic lymph nodes. Reproductive: Uterine fibroid. Other: No abdominal wall hernia or abnormality. No ascites. Musculoskeletal: No acute or significant osseous findings. IMPRESSION: Colonic diverticulosis with redemonstrated wall thickening of the sigmoid colon as well as adjacent fat stranding and small volume of reactive fluid in the left paracolic gutter. Wall thickening of the distal sigmoid colon is worsened compared to prior examination. Findings remain consistent with acute diverticulitis. No evidence of complicating perforation or abscess at this time. Electronically Signed   By: 14/08/2021 M.D.   On: 01/23/2022 15:36    EKG: not done   Labs on Admission: I have personally reviewed the available labs and imaging studies at the  time of the admission.  Pertinent labs:    Na++ 134 Lactate 0.7 WBC 6.5 Hgb 11.3 - stable UA WNL  Family Communication: None present Primary team communication: Patient with discussed with ER PA directly at the time of consult.  Thank you very much for involving [place] in the care of your patient.  Author: Jonah Blue, MD 01/23/2022 5:08 PM  For on call review [email or web address].

## 2022-01-24 DIAGNOSIS — K5792 Diverticulitis of intestine, part unspecified, without perforation or abscess without bleeding: Secondary | ICD-10-CM | POA: Diagnosis not present

## 2022-01-24 LAB — CBC
HCT: 31.5 % — ABNORMAL LOW (ref 36.0–46.0)
Hemoglobin: 10.3 g/dL — ABNORMAL LOW (ref 12.0–15.0)
MCH: 28.7 pg (ref 26.0–34.0)
MCHC: 32.7 g/dL (ref 30.0–36.0)
MCV: 87.7 fL (ref 80.0–100.0)
Platelets: 242 10*3/uL (ref 150–400)
RBC: 3.59 MIL/uL — ABNORMAL LOW (ref 3.87–5.11)
RDW: 12.1 % (ref 11.5–15.5)
WBC: 5.3 10*3/uL (ref 4.0–10.5)
nRBC: 0 % (ref 0.0–0.2)

## 2022-01-24 LAB — BASIC METABOLIC PANEL
Anion gap: 10 (ref 5–15)
BUN: 6 mg/dL (ref 6–20)
CO2: 25 mmol/L (ref 22–32)
Calcium: 8.7 mg/dL — ABNORMAL LOW (ref 8.9–10.3)
Chloride: 101 mmol/L (ref 98–111)
Creatinine, Ser: 0.66 mg/dL (ref 0.44–1.00)
GFR, Estimated: >60 mL/min (ref >60)
Glucose, Bld: 82 mg/dL (ref 70–99)
Potassium: 3.7 mmol/L (ref 3.5–5.1)
Sodium: 136 mmol/L (ref 135–145)

## 2022-01-24 NOTE — Progress Notes (Signed)
PROGRESS NOTE    Jennifer Stafford  MIW:[id number] DOB: 1965/11/27 DOA: 01/23/2022  PCP: Pcp, No   Brief Narrative:  This 56 years old female with PMH significant for diverticulitis presented in the ED with worsening pain associated with nausea and vomiting. She was seen in the ED on 12/7 and was discharged home with Cipro/ Flagyl and Norco for pain control. She reports the medications had made her pain worse. She has been having nausea and vomiting with taking medications.  She reports severe pain with bowel movements.  Patient denies any fever and chills. She reported having diverticulitis in the past but this time it is worse.  Patient is admitted for diverticulitis.  Assessment & Plan:   Principal Problem:   Diverticulitis Active Problems:   Class 2 obesity due to excess calories with body mass index (BMI) of 36.0 to 36.9 in adult  Acute diverticulitis: Patient presented with abdominal pain associated nausea and vomiting. CT abdomen and pelvis showed findings consistent with acute diverticulitis There is no signs of sepsis. She was treated with outpatient Cipro/ Flagyl but was unable to tolerate p.o. medication and failed outpatient therapy. Continue bowel rest, IV fluids,  pain control with morphine. Continue IV Zofran as needed for nausea vomiting. Continue Zosyn for intra-abdominal infection. Patient shows slight improvement. Started on clear liquid diet,  advance as tolerated.  Obesity: Diet and exercise discussed in detail. Estimated body mass index is 36.58 kg/m as calculated from the following:   Height as of 01/20/22: 5\' 2"  (1.575 m).   Weight as of 01/20/22: 90.7 kg.     DVT prophylaxis: Lovenox Code Status: Full code Family Communication: No family at bedside. Disposition Plan:  Status is: Inpatient Remains inpatient appropriate because: Admitted for acute diverticulitis,  requiring IV antibiotics.   Consultants:  None  Procedures: CT abdomen  pelvis Antimicrobials:  Anti-infectives (From admission, onward)    Start     Dose/Rate Route Frequency Ordered Stop   01/24/22 0600  piperacillin-tazobactam (ZOSYN) IVPB 3.375 g        3.375 g 12.5 mL/hr over 240 Minutes Intravenous Every 8 hours 01/23/22 1718     01/23/22 1630  piperacillin-tazobactam (ZOSYN) IVPB 3.375 g        3.375 g 100 mL/hr over 30 Minutes Intravenous  Once 01/23/22 1622 01/23/22 1721       Subjective: Patient was seen and examined at bedside.  Overnight events noted. Patient reports abdominal pain is improved.  Still has some abdominal soreness. Started on clear liquid diet, will advance as tolerated.  Objective: Vitals:   01/23/22 2020 01/23/22 2142 01/24/22 0455 01/24/22 0850  BP:  124/63 120/67 138/79  Pulse:  67 67 63  Resp: 16 18 18 17   Temp: 98.6 F (37 C) 99.1 F (37.3 C) 98.3 F (36.8 C) 98.3 F (36.8 C)  TempSrc: Oral Oral Oral Oral  SpO2:  100% 96% 100%    Intake/Output Summary (Last 24 hours) at 01/24/2022 1433 Last data filed at 01/24/2022 1320 Gross per 24 hour  Intake 978.18 ml  Output --  Net 978.18 ml   There were no vitals filed for this visit.  Examination:  General exam: Appears comfortable, not in any acute distress. Respiratory system: CTA bilaterally, respiratory effort normal, RR 15 Cardiovascular system: S1 & S2 heard, regular rate and rhythm, no murmur. Gastrointestinal system: Abdomen is soft, non tender, non distended, BS+ Central nervous system: Alert and oriented x 3. No focal neurological deficits. Extremities: No edema, no  cyanosis, no clubbing. Skin: No rashes, lesions or ulcers Psychiatry: Judgement and insight appear normal. Mood & affect appropriate.     Data Reviewed: I have personally reviewed following labs and imaging studies  CBC: Recent Labs  Lab 01/20/22 0927 01/23/22 1338 01/24/22 0307  WBC 5.3 6.5 5.3  NEUTROABS 2.7 4.4  --   HGB 11.3* 11.3* 10.3*  HCT 34.7* 34.7* 31.5*  MCV 87.6  87.4 87.7  PLT 273 265 242   Basic Metabolic Panel: Recent Labs  Lab 01/20/22 0927 01/23/22 1408 01/24/22 0307  NA 137 134* 136  K 4.0 4.0 3.7  CL 105 101 101  CO2 26 26 25   GLUCOSE 91 97 82  BUN 12 9 6   CREATININE 0.67 0.71 0.66  CALCIUM 9.3 8.8* 8.7*   GFR: Estimated Creatinine Clearance: 84.1 mL/min (by C-G formula based on SCr of 0.66 mg/dL). Liver Function Tests: Recent Labs  Lab 01/20/22 0927 01/23/22 1408  AST 16 18  ALT 15 14  ALKPHOS 61 57  BILITOT 0.7 0.7  PROT 7.4 7.4  ALBUMIN 3.7 3.5   Recent Labs  Lab 01/20/22 0927 01/23/22 1408  LIPASE 28 26   No results for input(s): "AMMONIA" in the last 168 hours. Coagulation Profile: No results for input(s): "INR", "PROTIME" in the last 168 hours. Cardiac Enzymes: No results for input(s): "CKTOTAL", "CKMB", "CKMBINDEX", "TROPONINI" in the last 168 hours. BNP (last 3 results) No results for input(s): "PROBNP" in the last 8760 hours. HbA1C: No results for input(s): "HGBA1C" in the last 72 hours. CBG: No results for input(s): "GLUCAP" in the last 168 hours. Lipid Profile: No results for input(s): "CHOL", "HDL", "LDLCALC", "TRIG", "CHOLHDL", "LDLDIRECT" in the last 72 hours. Thyroid Function Tests: No results for input(s): "TSH", "T4TOTAL", "FREET4", "T3FREE", "THYROIDAB" in the last 72 hours. Anemia Panel: No results for input(s): "VITAMINB12", "FOLATE", "FERRITIN", "TIBC", "IRON", "RETICCTPCT" in the last 72 hours. Sepsis Labs: Recent Labs  Lab 01/23/22 1338  LATICACIDVEN 0.7    No results found for this or any previous visit (from the past 240 hour(s)).   Radiology Studies: CT ABDOMEN PELVIS W CONTRAST  Result Date: 01/23/2022 CLINICAL DATA:  Left lower quadrant abdominal pain, diverticulitis, persistent pain following antibiotic treatment EXAM: CT ABDOMEN AND PELVIS WITH CONTRAST TECHNIQUE: Multidetector CT imaging of the abdomen and pelvis was performed using the standard protocol following bolus  administration of intravenous contrast. RADIATION DOSE REDUCTION: This exam was performed according to the departmental dose-optimization program which includes automated exposure control, adjustment of the mA and/or kV according to patient size and/or use of iterative reconstruction technique. CONTRAST:  14/10/23 OMNIPAQUE IOHEXOL 300 MG/ML  SOLN COMPARISON:  01/20/2022 FINDINGS: Lower chest: No acute abnormality. Hepatobiliary: No solid liver abnormality is seen. No gallstones, gallbladder wall thickening, or biliary dilatation. Pancreas: Unremarkable. No pancreatic ductal dilatation or surrounding inflammatory changes. Spleen: Normal in size without significant abnormality. Adrenals/Urinary Tract: Adrenal glands are unremarkable. Kidneys are normal, without renal calculi, solid lesion, or hydronephrosis. Bladder is unremarkable. Stomach/Bowel: Stomach is within normal limits. Appendix appears normal. Pancolonic diverticulosis, severe in the descending and sigmoid colon, with redemonstrated wall thickening of the sigmoid colon with adjacent fat stranding and small volume of reactive fluid in the left paracolic gutter (series 2, image 64). Wall thickening of the distal sigmoid colon is worsened compared to prior examination (series 5, image 57). Vascular/Lymphatic: No significant vascular findings are present. No enlarged abdominal or pelvic lymph nodes. Reproductive: Uterine fibroid. Other: No abdominal wall hernia or abnormality.  No ascites. Musculoskeletal: No acute or significant osseous findings. IMPRESSION: Colonic diverticulosis with redemonstrated wall thickening of the sigmoid colon as well as adjacent fat stranding and small volume of reactive fluid in the left paracolic gutter. Wall thickening of the distal sigmoid colon is worsened compared to prior examination. Findings remain consistent with acute diverticulitis. No evidence of complicating perforation or abscess at this time. Electronically Signed   By:  Jearld Lesch M.D.   On: 01/23/2022 15:36    Scheduled Meds: Continuous Infusions:  lactated ringers 100 mL/hr at 01/23/22 2207   piperacillin-tazobactam (ZOSYN)  IV 3.375 g (01/24/22 0519)     LOS: 1 day    Time spent: 50 mins    Lonita Debes, MD [facility]   If 7PM-7AM, please contact night-coverage

## 2022-01-25 DIAGNOSIS — K5792 Diverticulitis of intestine, part unspecified, without perforation or abscess without bleeding: Secondary | ICD-10-CM | POA: Diagnosis not present

## 2022-01-25 MED ORDER — CIPROFLOXACIN HCL 500 MG PO TABS: 500.0000 mg | ORAL_TABLET | Freq: Two times a day (BID) | ORAL | 0 refills | Status: AC

## 2022-01-25 MED ORDER — HYDROCODONE-ACETAMINOPHEN 5-325 MG PO TABS: 1.0000 | ORAL_TABLET | Freq: Four times a day (QID) | ORAL | 0 refills | Status: AC | PRN

## 2022-01-25 MED ORDER — BISACODYL 5 MG PO TBEC
10.0000 mg | DELAYED_RELEASE_TABLET | Freq: Once | ORAL | Status: AC
  Administered 2022-01-25: 10 mg via ORAL

## 2022-01-25 MED ORDER — METRONIDAZOLE 500 MG PO TABS: 500.0000 mg | ORAL_TABLET | Freq: Three times a day (TID) | ORAL | 0 refills | Status: AC

## 2022-01-25 NOTE — TOC Transition Note (Signed)
Transition of Care [facility]) - CM/SW Discharge Note   Patient Details  Name: [name] MRN: [id number] Date of Birth: 12-May-1965  [facility]) CM/SW Contact:  Tom-Johnson, Hershal Coria, RN Phone Number: 01/25/2022, 12:14 PM   Clinical Narrative:     Patient is scheduled for discharge today. No TOC needs or recommendations noted. Family to transport at discharge. No further TOC needs noted.     Final next level of care: Home/Self Care Barriers to Discharge: Barriers Resolved   Patient Goals and CMS Choice Patient states their goals for this hospitalization and ongoing recovery are:: To return home CMS Medicare.gov Compare Post Acute Care list provided to:: Patient Choice offered to / list presented to : NA  Discharge Placement                Patient to be transferred to facility by: Family      Discharge Plan and Services                DME Arranged: N/A DME Agency: NA       HH Arranged: NA HH Agency: NA        Social Determinants of Health (SDOH) Interventions Transportation Interventions: Intervention Not Indicated, Inpatient TOC, Patient Resources (Friends/Family)   Readmission Risk Interventions     No data to display

## 2022-01-25 NOTE — Progress Notes (Signed)
DISCHARGE NOTE HOME Richardean Chimera to be discharged Home per MD order. Discussed prescriptions and follow up appointments with the patient. Medication list explained in detail. Patient verbalized knowledge and  understanding.  Skin clean, dry and intact without evidence of skin break down, no evidence of skin tears noted. IV catheter discontinued intact. Site without signs and symptoms of complications. Dressing and pressure applied. Pt denies pain at the site currently. No complaints noted. Patient advised to return to ED if symptoms return. Work note given to patient.  An After Visit Summary (AVS) was printed and given to the patient. Patient escorted via wheelchair, and discharged home via private auto.  Tresa Endo, RN

## 2022-01-25 NOTE — Discharge Summary (Signed)
Physician Discharge Summary  Jennifer Stafford [id number] DOB: March 08, 1965 DOA: 01/23/2022  PCP: Pcp, No  Admit date: 01/23/2022  Discharge date: 01/25/2022  Admitted From: Home.  Disposition:Home.  Recommendations for Outpatient Follow-up:  Follow up with PCP in 1-2 weeks. Please obtain BMP/CBC in one week. Advised to take Ciprofloxacin 500 mg BID x 7 days. Advised to take Flagyl 500mg  TID X 7 days.  Home Health:None Equipment/Devices:None  Discharge Condition: Stable CODE STATUS:Full code Diet recommendation: Soft Blend diet  Brief Summary/ Hospital Course: This 56 years old female with PMH significant for diverticulitis presented in the ED with worsening pain associated with nausea and vomiting. She was seen in the ED on 01/20/22 and was discharged home with Cipro/ Flagyl and Norco for pain control. She reports the medications had made her pain worse. She has been having nausea and vomiting with taking medications. She reports severe pain with bowel movements.  Patient denies any fever and chills. She reported having diverticulitis in the past but this time it is worse.  Patient is admitted for diverticulitis. She was continued on iv zosyn and IV Zofran.  Patient was continued on IV hydration and bowel rest.  Patient reports slight improvement.  Started on clear liquid diet tolerated well advance to full liquids to soft.  Patient feels slight cramping but feels improved and better.  Patient wants to be discharged,  advised to take Cipro and Flagyl for 7 days.  Patient is being discharged home.    Discharge Diagnoses:  Principal Problem:   Diverticulitis Active Problems:   Class 2 obesity due to excess calories with body mass index (BMI) of 36.0 to 36.9 in adult  Acute diverticulitis: Patient presented with abdominal pain associated nausea and vomiting. CT abdomen and pelvis showed findings consistent with acute diverticulitis. There is no signs of sepsis. She was treated with  outpatient Cipro/ Flagyl but was unable to tolerate p.o. medication and failed outpatient therapy. Continue bowel rest, IV fluids,  pain control with morphine. Continue IV Zofran as needed for nausea and vomiting. Continue Zosyn for intra-abdominal infection. Patient shows slight improvement. Started on clear liquid diet,  advance as tolerated. Patient started on soft diet, tolerated well.   Obesity: Diet and exercise discussed in detail. Estimated body mass index is 36.58 kg/m as calculated from the following:   Height as of 01/20/22: 5\' 2"  (1.575 m).   Weight as of 01/20/22: 90.7 kg.   Discharge Instructions  Discharge Instructions     Call MD for:  difficulty breathing, headache or visual disturbances   Complete by: As directed    Call MD for:  persistant dizziness or light-headedness   Complete by: As directed    Call MD for:  persistant nausea and vomiting   Complete by: As directed    Diet - low sodium heart healthy   Complete by: As directed    Diet Carb Modified   Complete by: As directed    Discharge instructions   Complete by: As directed    Advised to follow up with PCP in one week. Advised to take Ciprofloxacin 500 mg BID x 7 days. Advised to take Flagyl 500mg  TID X 7 days.   Increase activity slowly   Complete by: As directed       Allergies as of 01/25/2022   No Known Allergies      Medication List     TAKE these medications    acetaminophen 500 MG tablet Commonly known as: TYLENOL Take 1,000 mg  by mouth every 6 (six) hours as needed for moderate pain.   Alka-Seltzer Original 325 MG Tbef tablet Generic drug: aspirin-sod bicarb-citric acid Take 325 mg by mouth every 6 (six) hours as needed (acid reflux).   bisacodyl 5 MG EC tablet Commonly known as: DULCOLAX Take 2 tablets (10 mg total) by mouth daily as needed for moderate constipation.   ciprofloxacin 500 MG tablet Commonly known as: Cipro Take 1 tablet (500 mg total) by mouth 2 (two) times  daily for 7 days.   HYDROcodone-acetaminophen 5-325 MG tablet Commonly known as: NORCO/VICODIN Take 1 tablet by mouth every 6 (six) hours as needed for severe pain.   metroNIDAZOLE 500 MG tablet Commonly known as: Flagyl Take 1 tablet (500 mg total) by mouth 3 (three) times daily for 7 days.        Follow-up [facility] up in 1 week(s).   Specialty: Internal Medicine Contact information: [address] [phone]               No Known Allergies  Consultations: None   Procedures/Studies: CT ABDOMEN PELVIS W CONTRAST  Result Date: 01/23/2022 CLINICAL DATA:  Left lower quadrant abdominal pain, diverticulitis, persistent pain following antibiotic treatment EXAM: CT ABDOMEN AND PELVIS WITH CONTRAST TECHNIQUE: Multidetector CT imaging of the abdomen and pelvis was performed using the standard protocol following bolus administration of intravenous contrast. RADIATION DOSE REDUCTION: This exam was performed according to the departmental dose-optimization program which includes automated exposure control, adjustment of the mA and/or kV according to patient size and/or use of iterative reconstruction technique. CONTRAST:  1109mL OMNIPAQUE IOHEXOL 300 MG/ML  SOLN COMPARISON:  01/20/2022 FINDINGS: Lower chest: No acute abnormality. Hepatobiliary: No solid liver abnormality is seen. No gallstones, gallbladder wall thickening, or biliary dilatation. Pancreas: Unremarkable. No pancreatic ductal dilatation or surrounding inflammatory changes. Spleen: Normal in size without significant abnormality. Adrenals/Urinary Tract: Adrenal glands are unremarkable. Kidneys are normal, without renal calculi, solid lesion, or hydronephrosis. Bladder is unremarkable. Stomach/Bowel: Stomach is within normal limits. Appendix appears normal. Pancolonic diverticulosis, severe in the descending and sigmoid colon, with  redemonstrated wall thickening of the sigmoid colon with adjacent fat stranding and small volume of reactive fluid in the left paracolic gutter (series 2, image 64). Wall thickening of the distal sigmoid colon is worsened compared to prior examination (series 5, image 57). Vascular/Lymphatic: No significant vascular findings are present. No enlarged abdominal or pelvic lymph nodes. Reproductive: Uterine fibroid. Other: No abdominal wall hernia or abnormality. No ascites. Musculoskeletal: No acute or significant osseous findings. IMPRESSION: Colonic diverticulosis with redemonstrated wall thickening of the sigmoid colon as well as adjacent fat stranding and small volume of reactive fluid in the left paracolic gutter. Wall thickening of the distal sigmoid colon is worsened compared to prior examination. Findings remain consistent with acute diverticulitis. No evidence of complicating perforation or abscess at this time. Electronically Signed   By: Delanna Ahmadi M.D.   On: 01/23/2022 15:36   CT Abdomen Pelvis W Contrast  Result Date: 01/20/2022 CLINICAL DATA:  Left lower quadrant abdominal pain, history of diverticulitis. EXAM: CT ABDOMEN AND PELVIS WITH CONTRAST TECHNIQUE: Multidetector CT imaging of the abdomen and pelvis was performed using the standard protocol following bolus administration of intravenous contrast. RADIATION DOSE REDUCTION: This exam was performed according to the departmental dose-optimization program which includes automated exposure control, adjustment of the mA and/or kV according to patient size and/or use  of iterative reconstruction technique. CONTRAST:  172mL OMNIPAQUE IOHEXOL 300 MG/ML  SOLN COMPARISON:  CT examination dated February 24, 2021 FINDINGS: Lower chest: No acute abnormality. Hepatobiliary: No focal liver abnormality is seen. No gallstones, gallbladder wall thickening, or biliary dilatation. Pancreas: Unremarkable. No pancreatic ductal dilatation or surrounding inflammatory  changes. Spleen: Normal in size without focal abnormality. Adrenals/Urinary Tract: Adrenal glands are unremarkable. Kidneys are normal, without renal calculi, focal lesion, or hydronephrosis. Bladder is unremarkable. Stomach/Bowel: Stomach is within normal limits. Appendix appears normal. Colonic diverticulosis prominent in the descending and sigmoid colon. There is focal sigmoid colonic wall thickening with adjacent fat stranding consistent with acute diverticulitis. No adjacent fluid collection or abscess. No extraluminal free air. Vascular/Lymphatic: No significant vascular findings are present. No enlarged abdominal or pelvic lymph nodes. Reproductive: Uterus and bilateral adnexa are unremarkable. Other: No abdominal wall hernia or abnormality. No abdominopelvic ascites. 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 No acute osseous abnormality. IMPRESSION: 1. Colonic diverticulosis with focal sigmoid colonic wall thickening and adjacent fat stranding consistent with acute diverticulitis. No adjacent fluid collection or abscess. 2. No evidence of bowel obstruction. Normal appendix. 3. 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 Electronically Signed   By: Keane Police D.O.   On: 01/20/2022 10:46     Subjective: Patient was seen and examined at bedside.  Overnight events noted.   Patient reports doing much better.  Patient wants to be discharged.  Patient being discharged home.  Discharge Exam: Vitals:   01/25/22 0523 01/25/22 0946  BP: (!) 132/90 (!) 147/85  Pulse: 76 67  Resp:  18  Temp:  98.2 F (36.8 C)  SpO2:  98%   Vitals:   01/24/22 2145 01/25/22 0522 01/25/22 0523 01/25/22 0946  BP: 139/81 93/64 (!) 132/90 (!) 147/85  Pulse: 73 78 76 67  Resp: 18 18  18   Temp: 98.1 F (36.7 C) 98.1 F (36.7 C)  98.2 F (36.8 C)  TempSrc: Oral Oral  Oral  SpO2: 97% 100%  98%     General: Pt is alert, awake, not in acute distress Cardiovascular: RRR, S1/S2 +, no rubs, no gallops Respiratory: CTA bilaterally, no wheezing, no rhonchi Abdominal: Soft, NT, ND, bowel sounds + Extremities: no edema, no cyanosis    The results of significant diagnostics from this hospitalization (including imaging, microbiology, ancillary and laboratory) are listed below for reference.     Microbiology: No results found for this or any previous visit (from the past 240 hour(s)).   Labs: BNP (last 3 results) No results for input(s): "BNP" in the last 8760 hours. Basic Metabolic Panel: Recent Labs  Lab 01/20/22 0927 01/23/22 1408 01/24/22 0307  NA 137 134* 136  K 4.0 4.0 3.7  CL 105 101 101  CO2 26 26 25   GLUCOSE 91 97 82  BUN 12 9 6   CREATININE 0.67 0.71 0.66  CALCIUM 9.3 8.8* 8.7*   Liver Function Tests: Recent Labs  Lab 01/20/22 0927 01/23/22 1408  AST 16 18  ALT 15 14  ALKPHOS 61 57  BILITOT 0.7 0.7  PROT 7.4 7.4  ALBUMIN 3.7 3.5   Recent Labs  Lab 01/20/22 0927 01/23/22 1408  LIPASE 28 26   No results for input(s): "AMMONIA" in the last 168 hours. CBC: Recent Labs  Lab 01/20/22 0927 01/23/22 1338 01/24/22 0307  WBC 5.3 6.5 5.3  NEUTROABS 2.7 4.4  --  HGB 11.3* 11.3* 10.3*  HCT 34.7* 34.7* 31.5*  MCV 87.6 87.4 87.7  PLT 273 265 242   Cardiac Enzymes: No results for input(s): "CKTOTAL", "CKMB", "CKMBINDEX", "TROPONINI" in the last 168 hours. BNP: Invalid input(s): "POCBNP" CBG: No results for input(s): "GLUCAP" in the last 168 hours. D-Dimer No results for input(s): "DDIMER" in the last 72 hours. Hgb A1c No results for input(s): "HGBA1C" in the last 72 hours. Lipid Profile No results for input(s): "CHOL", "HDL", "LDLCALC", "TRIG", "CHOLHDL", "LDLDIRECT" in the last 72 hours. Thyroid function studies No results for input(s): "TSH", "T4TOTAL", "T3FREE", "THYROIDAB" in the last 72 hours.  Invalid input(s): "FREET3" Anemia work  up No results for input(s): "VITAMINB12", "FOLATE", "FERRITIN", "TIBC", "IRON", "RETICCTPCT" in the last 72 hours. Urinalysis    Component Value Date/Time   COLORURINE YELLOW 01/23/2022 1315   APPEARANCEUR HAZY (A) 01/23/2022 1315   LABSPEC 1.025 01/23/2022 1315   PHURINE 7.0 01/23/2022 1315   GLUCOSEU NEGATIVE 01/23/2022 1315   HGBUR NEGATIVE 01/23/2022 1315   BILIRUBINUR NEGATIVE 01/23/2022 1315   KETONESUR NEGATIVE 01/23/2022 1315   PROTEINUR NEGATIVE 01/23/2022 1315   NITRITE NEGATIVE 01/23/2022 1315   LEUKOCYTESUR NEGATIVE 01/23/2022 1315   Sepsis Labs Recent Labs  Lab 01/20/22 0927 01/23/22 1338 01/24/22 0307  WBC 5.3 6.5 5.3   Microbiology No results found for this or any previous visit (from the past 240 hour(s)).   Time coordinating discharge: Over 30 minutes  SIGNED:   Cipriano Bunker, MD  [facility] 01/25/2022, 3:11 PM Pager   If 7PM-7AM, please contact night-coverage

## 2022-01-25 NOTE — Discharge Instructions (Signed)
Advised to follow up with PCP in one week. Advised to take Ciprofloxacin 500 mg BID x 7 days. Advised to take Flagyl 500mg  TID X 7 days.
